# Patient Record
Sex: Female | Born: 1960 | Race: White | Hispanic: No | Marital: Married | Smoking: Never smoker
Health system: Southern US, Community
[De-identification: ages and names within clinical notes are randomized; demographics above are authoritative.]

## PROBLEM LIST (undated history)

## (undated) DIAGNOSIS — T7840XA Allergy, unspecified, initial encounter: Secondary | ICD-10-CM

## (undated) DIAGNOSIS — F32A Depression, unspecified: Secondary | ICD-10-CM

## (undated) DIAGNOSIS — R51 Headache: Secondary | ICD-10-CM

## (undated) DIAGNOSIS — R5382 Chronic fatigue, unspecified: Secondary | ICD-10-CM

## (undated) DIAGNOSIS — F419 Anxiety disorder, unspecified: Secondary | ICD-10-CM

## (undated) DIAGNOSIS — F329 Major depressive disorder, single episode, unspecified: Secondary | ICD-10-CM

## (undated) DIAGNOSIS — I341 Nonrheumatic mitral (valve) prolapse: Secondary | ICD-10-CM

## (undated) HISTORY — DX: Depression, unspecified: F32.A

## (undated) HISTORY — DX: Anxiety disorder, unspecified: F41.9

## (undated) HISTORY — DX: Chronic fatigue, unspecified: R53.82

## (undated) HISTORY — DX: Headache: R51

## (undated) HISTORY — DX: Allergy, unspecified, initial encounter: T78.40XA

## (undated) HISTORY — DX: Nonrheumatic mitral (valve) prolapse: I34.1

## (undated) HISTORY — PX: TUBAL LIGATION: SHX77

## (undated) HISTORY — PX: BREAST SURGERY: SHX581

## (undated) HISTORY — DX: Major depressive disorder, single episode, unspecified: F32.9

## (undated) HISTORY — PX: APPENDECTOMY: SHX54

---

## 1998-02-25 ENCOUNTER — Other Ambulatory Visit: Admission: RE | Admit: 1998-02-25 | Discharge: 1998-02-25 | Payer: Self-pay | Admitting: Dermatology

## 1999-07-09 ENCOUNTER — Other Ambulatory Visit: Admission: RE | Admit: 1999-07-09 | Discharge: 1999-07-09 | Payer: Self-pay | Admitting: Obstetrics and Gynecology

## 1999-07-30 ENCOUNTER — Ambulatory Visit (HOSPITAL_COMMUNITY): Admission: RE | Admit: 1999-07-30 | Discharge: 1999-07-30 | Payer: Self-pay | Admitting: Obstetrics and Gynecology

## 1999-07-30 ENCOUNTER — Encounter: Payer: Self-pay | Admitting: Obstetrics and Gynecology

## 1999-08-01 ENCOUNTER — Encounter: Payer: Self-pay | Admitting: Obstetrics and Gynecology

## 1999-08-01 ENCOUNTER — Ambulatory Visit (HOSPITAL_COMMUNITY): Admission: RE | Admit: 1999-08-01 | Discharge: 1999-08-01 | Payer: Self-pay | Admitting: Obstetrics and Gynecology

## 2001-05-24 ENCOUNTER — Other Ambulatory Visit: Admission: RE | Admit: 2001-05-24 | Discharge: 2001-05-24 | Payer: Self-pay | Admitting: Obstetrics and Gynecology

## 2002-07-17 ENCOUNTER — Other Ambulatory Visit: Admission: RE | Admit: 2002-07-17 | Discharge: 2002-07-17 | Payer: Self-pay | Admitting: Obstetrics and Gynecology

## 2003-08-28 ENCOUNTER — Other Ambulatory Visit: Admission: RE | Admit: 2003-08-28 | Discharge: 2003-08-28 | Payer: Self-pay | Admitting: Obstetrics and Gynecology

## 2004-01-23 ENCOUNTER — Other Ambulatory Visit: Admission: RE | Admit: 2004-01-23 | Discharge: 2004-01-23 | Payer: Self-pay | Admitting: Obstetrics and Gynecology

## 2004-07-23 ENCOUNTER — Other Ambulatory Visit: Admission: RE | Admit: 2004-07-23 | Discharge: 2004-07-23 | Payer: Self-pay | Admitting: Obstetrics and Gynecology

## 2004-08-04 ENCOUNTER — Ambulatory Visit (HOSPITAL_COMMUNITY): Admission: RE | Admit: 2004-08-04 | Discharge: 2004-08-04 | Payer: Self-pay | Admitting: Obstetrics and Gynecology

## 2005-03-19 ENCOUNTER — Ambulatory Visit: Payer: Self-pay | Admitting: Family Medicine

## 2005-04-09 ENCOUNTER — Ambulatory Visit: Payer: Self-pay | Admitting: Family Medicine

## 2005-04-17 ENCOUNTER — Ambulatory Visit: Payer: Self-pay | Admitting: Family Medicine

## 2005-11-12 ENCOUNTER — Other Ambulatory Visit: Admission: RE | Admit: 2005-11-12 | Discharge: 2005-11-12 | Payer: Self-pay | Admitting: Obstetrics and Gynecology

## 2005-11-25 ENCOUNTER — Ambulatory Visit (HOSPITAL_COMMUNITY): Admission: RE | Admit: 2005-11-25 | Discharge: 2005-11-25 | Payer: Self-pay | Admitting: Obstetrics and Gynecology

## 2006-01-29 ENCOUNTER — Ambulatory Visit: Payer: Self-pay | Admitting: Family Medicine

## 2007-02-10 ENCOUNTER — Ambulatory Visit (HOSPITAL_COMMUNITY): Admission: RE | Admit: 2007-02-10 | Discharge: 2007-02-10 | Payer: Self-pay | Admitting: Obstetrics and Gynecology

## 2007-02-20 ENCOUNTER — Emergency Department (HOSPITAL_COMMUNITY): Admission: EM | Admit: 2007-02-20 | Discharge: 2007-02-21 | Payer: Self-pay | Admitting: Emergency Medicine

## 2007-05-16 DIAGNOSIS — R51 Headache: Secondary | ICD-10-CM | POA: Insufficient documentation

## 2007-05-16 DIAGNOSIS — R519 Headache, unspecified: Secondary | ICD-10-CM | POA: Insufficient documentation

## 2008-05-16 ENCOUNTER — Ambulatory Visit: Payer: Self-pay | Admitting: Family Medicine

## 2008-05-16 LAB — CONVERTED CEMR LAB
Bilirubin Urine: NEGATIVE
Blood in Urine, dipstick: NEGATIVE
Glucose, Urine, Semiquant: NEGATIVE
Nitrite: NEGATIVE
Protein, U semiquant: NEGATIVE
Specific Gravity, Urine: 1.025
Urobilinogen, UA: 0.2
pH: 6

## 2008-05-18 ENCOUNTER — Encounter: Payer: Self-pay | Admitting: Family Medicine

## 2008-05-18 LAB — CONVERTED CEMR LAB
AST: 17 units/L (ref 0–37)
Alkaline Phosphatase: 18 units/L — ABNORMAL LOW (ref 39–117)
Anti Nuclear Antibody(ANA): NEGATIVE
Basophils Absolute: 0.1 10*3/uL (ref 0.0–0.1)
Bilirubin, Direct: 0.1 mg/dL (ref 0.0–0.3)
Chloride: 107 meq/L (ref 96–112)
Eosinophils Absolute: 0.1 10*3/uL (ref 0.0–0.7)
GFR calc non Af Amer: 72 mL/min
MCHC: 34.7 g/dL (ref 30.0–36.0)
MCV: 101.1 fL — ABNORMAL HIGH (ref 78.0–100.0)
Neutrophils Relative %: 69.7 % (ref 43.0–77.0)
Platelets: 274 10*3/uL (ref 150–400)
Potassium: 3.5 meq/L (ref 3.5–5.1)
Sodium: 142 meq/L (ref 135–145)
Total Bilirubin: 1 mg/dL (ref 0.3–1.2)
Vitamin B-12: 572 pg/mL (ref 211–911)

## 2008-07-25 ENCOUNTER — Ambulatory Visit: Payer: Self-pay | Admitting: Family Medicine

## 2008-07-25 DIAGNOSIS — J309 Allergic rhinitis, unspecified: Secondary | ICD-10-CM | POA: Insufficient documentation

## 2008-10-02 ENCOUNTER — Encounter: Payer: Self-pay | Admitting: Family Medicine

## 2008-10-17 ENCOUNTER — Telehealth: Payer: Self-pay | Admitting: Family Medicine

## 2008-11-14 ENCOUNTER — Telehealth: Payer: Self-pay | Admitting: Family Medicine

## 2009-03-05 ENCOUNTER — Ambulatory Visit: Payer: Self-pay | Admitting: Family Medicine

## 2009-03-26 ENCOUNTER — Telehealth: Payer: Self-pay | Admitting: Family Medicine

## 2009-03-28 ENCOUNTER — Encounter: Payer: Self-pay | Admitting: Family Medicine

## 2009-04-12 ENCOUNTER — Telehealth: Payer: Self-pay | Admitting: Family Medicine

## 2009-07-16 ENCOUNTER — Telehealth: Payer: Self-pay | Admitting: Family Medicine

## 2009-07-17 ENCOUNTER — Ambulatory Visit: Payer: Self-pay | Admitting: Family Medicine

## 2009-09-02 ENCOUNTER — Telehealth: Payer: Self-pay | Admitting: Family Medicine

## 2009-10-09 ENCOUNTER — Telehealth: Payer: Self-pay | Admitting: Family Medicine

## 2009-11-25 ENCOUNTER — Ambulatory Visit: Payer: Self-pay | Admitting: Family Medicine

## 2009-11-25 LAB — CONVERTED CEMR LAB: Rapid Strep: NEGATIVE

## 2010-09-01 ENCOUNTER — Telehealth: Payer: Self-pay | Admitting: Family Medicine

## 2010-09-30 ENCOUNTER — Telehealth: Payer: Self-pay | Admitting: *Deleted

## 2010-10-07 ENCOUNTER — Telehealth: Payer: Self-pay | Admitting: Family Medicine

## 2010-10-10 ENCOUNTER — Encounter: Payer: Self-pay | Admitting: Family Medicine

## 2010-10-21 NOTE — Progress Notes (Signed)
Summary: Patches for seasickness  Phone Note Call from Patient   Caller: Patient Call For: Nelwyn Salisbury MD Summary of Call: Pt needs seasickness patches sent to [facility] for a 6 day cruise, please.  Initial call taken by: Lynann Beaver CMA,  October 09, 2009 9:02 AM  Follow-up for Phone Call        call in scopolamine patches, apply one behind the ear q 3 days as needed , #5 with 2 rf Follow-up by: Nelwyn Salisbury MD,  October 09, 2009 4:56 PM  Additional Follow-up for Phone Call Additional follow up Details #1::        Phone Call Completed, Rx Called In Additional Follow-up by: Alfred Levins, CMA,  October 09, 2009 5:04 PM    New/Updated Medications: TRANSDERM-SCOP 1.5 MG PT72 (SCOPOLAMINE BASE) apply behind the ear every 3 days as needed Prescriptions: TRANSDERM-SCOP 1.5 MG PT72 (SCOPOLAMINE BASE) apply behind the ear every 3 days as needed  #5 x 2   Entered by:   Alfred Levins, CMA   Authorized by:   Nelwyn Salisbury MD   Signed by:   Alfred Levins, CMA on 10/09/2009   Method used:   Electronically to        [facility]* (retail)       [address]       [place], [place]  [id number]       Ph: [phone]       Fax: [phone]   RxID:   [id number]

## 2010-10-21 NOTE — Assessment & Plan Note (Signed)
Summary: severe sore throat/cjr   Vital Signs:  Patient profile:   50 year old female Weight:      104 pounds Temp:     98.8 degrees F oral BP sitting:   110 / 82  (left arm) Cuff size:   regular  Vitals Entered By: Raechel Ache, RN (November 25, 2009 4:12 PM) CC: C/o bad sore throat since Sat morning.   History of Present Illness: Here for 3 days of a severe ST with few other symptoms. No fever or sinus drainage or cough. Drinking fluids and taking Motrin.   Allergies: 1)  ! Erythromycin  Past History:  Past Medical History: Reviewed history from 07/17/2009 and no changes required. Headache MVP Allergies, sees Dr. Corinda Gubler Depression chronic fatigue sees Dr. Pennie Rushing for GYN exams Anxiety  Review of Systems  The patient denies anorexia, fever, weight loss, weight gain, vision loss, decreased hearing, hoarseness, chest pain, syncope, dyspnea on exertion, peripheral edema, prolonged cough, headaches, hemoptysis, abdominal pain, melena, hematochezia, severe indigestion/heartburn, hematuria, incontinence, genital sores, muscle weakness, suspicious skin lesions, transient blindness, difficulty walking, depression, unusual weight change, abnormal bleeding, enlarged lymph nodes, angioedema, breast masses, and testicular masses.    Physical Exam  General:  Well-developed,well-nourished,in no acute distress; alert,appropriate and cooperative throughout examination Head:  Normocephalic and atraumatic without obvious abnormalities. No apparent alopecia or balding. Eyes:  No corneal or conjunctival inflammation noted. EOMI. Perrla. Funduscopic exam benign, without hemorrhages, exudates or papilledema. Vision grossly normal. Ears:  External ear exam shows no significant lesions or deformities.  Otoscopic examination reveals clear canals, tympanic membranes are intact bilaterally without bulging, retraction, inflammation or discharge. Hearing is grossly normal bilaterally. Nose:  External  nasal examination shows no deformity or inflammation. Nasal mucosa are pink and moist without lesions or exudates. Mouth:  posterior OP is red with no exudate. Uvula is a bit swollen.  Neck:  No deformities, masses, or tenderness noted. Lungs:  Normal respiratory effort, chest expands symmetrically. Lungs are clear to auscultation, no crackles or wheezes. Cervical Nodes:  No lymphadenopathy noted   Impression & Recommendations:  Problem # 1:  PHARYNGITIS (ICD-462)  Her updated medication list for this problem includes:    Minocin 100 Mg Solr (Minocycline hcl) ..... Once daily  Complete Medication List: 1)  Clarinex-d 24 Hour 5-240 Mg Xr24h-tab (Desloratadine-pseudoephedrine) .... Once daily 2)  Minocin 100 Mg Solr (Minocycline hcl) .... Once daily 3)  Temazepam 30 Mg Caps (Temazepam) .... At bedtime 4)  Lexapro 20 Mg Tabs (Escitalopram oxalate) .... Once daily 5)  Alprazolam 0.5 Mg Tabs (Alprazolam) .... Two times a day 6)  Transderm-scop 1.5 Mg Pt72 (Scopolamine base) .... Apply behind the ear every 3 days as needed 7)  Prednisone (pak) 10 Mg Tabs (Prednisone) .... As directed for  6 days  Other Orders: Rapid Strep ([address])  Patient Instructions: 1)  This is viral and should be self limited. try prednisone for comfort.  2)  Please schedule a follow-up appointment as needed .  Prescriptions: LEXAPRO 20 MG TABS (ESCITALOPRAM OXALATE) once daily  #30 x 11   Entered and Authorized by:   Nelwyn Salisbury MD   Signed by:   Nelwyn Salisbury MD on 11/25/2009   Method used:   Electronically to        [facility]* (retail)       [address]       [place], [place]  [address]       Ph: [phone]  Fax: [phone]   RxID:   [id number] PREDNISONE (PAK) 10 MG TABS (PREDNISONE) as directed for  6 days  #1 x 0   Entered and Authorized by:   Nelwyn Salisbury MD   Signed by:   Nelwyn Salisbury MD on 11/25/2009   Method used:   Electronically to        [facility]*  (retail)       [address]       [place], [place]  [id number]       Ph: [phone]       Fax: [phone]   RxID:   [id number]   Laboratory Results  Date/Time Received: November 25, 2009 4:24 PM  Date/Time Reported: November 25, 2009 4:24 PM   Other Tests  Rapid Strep: negative Comments Wynona Canes, CMA  November 25, 2009 4:24 PM

## 2010-10-23 NOTE — Progress Notes (Signed)
Summary: Sierra Peters called to check on status of Restoril.   Phone Note Call from Patient Call back at [facility] Phone [phone]   Caller: Patient Summary of Call: Sierra Peters called to check on status of sleep med. Sierra Peters req that this be called in today if at all possible, if not tomorrow at the latest. Sierra Peters is completely out of Restoril.    Initial call taken by: Lucy Antigua,  September 01, 2010 4:01 PM  Follow-up for Phone Call        call in #30 with 5 rf Follow-up by: Nelwyn Salisbury MD,  September 02, 2010 8:47 AM    Prescriptions: TEMAZEPAM 30 MG CAPS (TEMAZEPAM) at bedtime  #30 x 5   Entered by:   Lynann Beaver CMA AAMA   Authorized by:   Nelwyn Salisbury MD   Signed by:   Lynann Beaver CMA AAMA on 09/02/2010   Method used:   Telephoned to ...       [facility]* (retail)       [address]       [place], [place]  [id number]       Ph: [phone]       Fax: [phone]   RxID:   [id number]

## 2010-10-23 NOTE — Progress Notes (Signed)
Summary: Pt req refill of Lexapro to [facility]. Pt name changed to Sierra Peters  Phone Note Refill Request Call back at Home Phone [phone] Call back at [phone] cell Message from:  Patient on September 30, 2010 9:46 AM  Refills Requested: Medication #1:  LEXAPRO 20 MG TABS once daily   Dosage confirmed as above?Dosage Confirmed   Supply Requested: 1 month Pt called and needs 1 month supply to [facility] order pharmacy.  Pt has gotten married and her new last name is [name].  When this med is sent to [facility], be sure to but pts new name on script. Mem Id # [id number]   Pt only has 6 days lft of med. Pls call in asap   Method Requested: Telephone to Pharmacy Initial call taken by: Lucy Antigua,  September 30, 2010 9:46 AM  Follow-up for Phone Call        call in #30 with 11 rf  Follow-up by: Nelwyn Salisbury MD,  September 30, 2010 2:36 PM  Additional Follow-up for Phone Call Additional follow up Details #1::        Rx sent electronically. Additional Follow-up by: Romualdo Bolk, CMA (AAMA),  September 30, 2010 4:14 PM    Prescriptions: LEXAPRO 20 MG TABS (ESCITALOPRAM OXALATE) once daily  #90 x 3   Entered by:   Romualdo Bolk, CMA (AAMA)   Authorized by:   Nelwyn Salisbury MD   Signed by:   Romualdo Bolk, CMA (AAMA) on 09/30/2010   Method used:   Electronically to        [facility]* (retail)             ,          Ph: [phone]       Fax: [phone]   RxID:   [id number]

## 2010-10-23 NOTE — Progress Notes (Signed)
Summary: pa lexapro  Phone Note Call from Patient Call back at Home Phone [phone]   Caller: Patient Call For: Nelwyn Salisbury MD Summary of Call: Pt needs PA lexapro 20 mg call [phone]   Initial call taken by: Heron Sabins,  October 07, 2010 10:57 AM  Follow-up for Phone Call        PA # [id number] Follow-up by: Heron Sabins,  October 07, 2010 2:41 PM  Additional Follow-up for Phone Call Additional follow up Details #1::        pa in doc folder Additional Follow-up by: Heron Sabins,  October 07, 2010 5:21 PM    Additional Follow-up for Phone Call Additional follow up Details #2::    done  Follow-up by: Nelwyn Salisbury MD,  October 08, 2010 8:41 AM  Additional Follow-up for Phone Call Additional follow up Details #3:: Details for Additional Follow-up Action Taken: Pt called and checked with [facility] last night, and Lexapro is still not been called in. Pls call. PT IS AWARE PA IS WAITING ON DOC Heron Sabins  October 09, 2010 10:06 AM  recieved PA approved  today 10-13-2010 approved til 09-21-2010 til 09/20/2098   Pura Spice, RN  October 13, 2010 3:58 PM  Additional Follow-up by: Lucy Antigua,  October 09, 2010 9:23 AM  Prescriptions: LEXAPRO 20 MG TABS (ESCITALOPRAM OXALATE) once daily  #30 x 11   Entered by:   Pura Spice, RN   Authorized by:   Nelwyn Salisbury MD   Signed by:   Pura Spice, RN on 10/13/2010   Method used:   Electronically to        [facility]* (retail)       [address]       [place], [place]  [id number]       Ph: [phone]       Fax: [phone]   RxID:   [id number]

## 2010-10-23 NOTE — Progress Notes (Signed)
Summary: SAMPLES PLEASE  Phone Note Call from Patient Call back at [phone]   Caller: Patient Call For: Sierra Salisbury MD Summary of Call: pt has 2 lexapro pills left needs samples waiting on PA Initial call taken by: Heron Sabins,  October 07, 2010 10:59 AM  Follow-up for Phone Call        no samples available. We can send in a 30 day supply locally  Follow-up by: Sierra Salisbury MD,  October 07, 2010 1:08 PM  Additional Follow-up for Phone Call Additional follow up Details #1::        pt called. called [place] [facility]  Additional Follow-up by: Pura Spice, RN,  October 07, 2010 4:53 PM     Prescriptions: LEXAPRO 20 MG TABS (ESCITALOPRAM OXALATE) once daily  #30 x 0   Entered by:   Pura Spice, RN   Authorized by:   Sierra Salisbury MD   Signed by:   Pura Spice, RN on 10/07/2010   Method used:   Electronically to        [facility]* (retail)       [address]       [place], [place]  [address]       Ph: [phone]       Fax: [phone]   RxID:   [id number]

## 2010-10-23 NOTE — Medication Information (Signed)
Summary: Approval for Lexapro  Approval for Lexapro   Imported By: Maryln Gottron 10/17/2010 15:37:07  _____________________________________________________________________  External Attachment:    Type:   Image     Comment:   External Document

## 2010-10-29 NOTE — Medication Information (Signed)
Summary: Prior Authorization Request for Lexapro  Prior Authorization Request for Lexapro   Imported By: Maryln Gottron 10/22/2010 10:32:36  _____________________________________________________________________  External Attachment:    Type:   Image     Comment:   External Document

## 2010-11-03 ENCOUNTER — Telehealth: Payer: Self-pay | Admitting: *Deleted

## 2010-11-03 NOTE — Telephone Encounter (Signed)
agreed

## 2010-11-03 NOTE — Telephone Encounter (Signed)
Per Dr. Clent Ridges, go straight to ER.

## 2010-11-03 NOTE — Telephone Encounter (Signed)
Pt is complaining of pain under rib x 2 days and very SOB today.  Per Dr. Lovell Sheehan , go to ER ASAP

## 2010-11-04 ENCOUNTER — Encounter: Payer: Self-pay | Admitting: Family Medicine

## 2010-11-05 ENCOUNTER — Ambulatory Visit (INDEPENDENT_AMBULATORY_CARE_PROVIDER_SITE_OTHER): Payer: 59 | Admitting: Family Medicine

## 2010-11-05 ENCOUNTER — Encounter: Payer: Self-pay | Admitting: Family Medicine

## 2010-11-05 VITALS — BP 108/64 | HR 77 | Temp 98.1°F | Wt 106.0 lb

## 2010-11-05 DIAGNOSIS — R1011 Right upper quadrant pain: Secondary | ICD-10-CM

## 2010-11-05 LAB — POCT URINALYSIS DIPSTICK
Bilirubin, UA: NEGATIVE
Blood, UA: NEGATIVE
Glucose, UA: NEGATIVE
Ketones, UA: NEGATIVE
Leukocytes, UA: NEGATIVE
Nitrite, UA: NEGATIVE
Protein, UA: NEGATIVE
Spec Grav, UA: 1.02
Urobilinogen, UA: 0.2
pH, UA: 7.5

## 2010-11-05 LAB — HEPATIC FUNCTION PANEL
ALT: 19 U/L (ref 0–35)
AST: 19 U/L (ref 0–37)
Albumin: 3.9 g/dL (ref 3.5–5.2)
Alkaline Phosphatase: 22 U/L — ABNORMAL LOW (ref 39–117)
Bilirubin, Direct: 0.1 mg/dL (ref 0.0–0.3)
Total Bilirubin: 0.4 mg/dL (ref 0.3–1.2)
Total Protein: 6.2 g/dL (ref 6.0–8.3)

## 2010-11-05 LAB — BASIC METABOLIC PANEL
BUN: 23 mg/dL (ref 6–23)
CO2: 31 mEq/L (ref 19–32)
Calcium: 9.3 mg/dL (ref 8.4–10.5)
Chloride: 107 mEq/L (ref 96–112)
Creatinine, Ser: 0.8 mg/dL (ref 0.4–1.2)
GFR: 79.14 mL/min (ref >60.00)
Glucose, Bld: 78 mg/dL (ref 70–99)
Potassium: 4.5 mEq/L (ref 3.5–5.1)
Sodium: 142 mEq/L (ref 135–145)

## 2010-11-05 MED ORDER — OMEPRAZOLE 40 MG PO CPDR: 40.0000 mg | DELAYED_RELEASE_CAPSULE | Freq: Two times a day (BID) | ORAL | Status: DC

## 2010-11-05 NOTE — Progress Notes (Signed)
  Subjective:    Patient ID: Sierra Peters, female    DOB: 1961/09/14, 50 y.o.   MRN: [id number]  HPI Here for 5 days of constant aching pains in the RUQ with nausea but no vomiting. No cough but she feels SOB at times. No fever. No change in urinations or BMs. TUMS does not help. She has been under a lot of stress the past 6 months, after she lost her job and only recently found another job. She also has been drinking a lot more alcohol lately, averaging 5-6 drinks of wine and vodka every day.    Review of Systems  Constitutional: Negative.   Respiratory: Positive for shortness of breath. Negative for apnea, cough, choking, chest tightness, wheezing and stridor.   Cardiovascular: Negative.   Gastrointestinal: Positive for nausea and abdominal pain. Negative for vomiting, diarrhea, constipation, blood in stool, abdominal distention and rectal pain.  Genitourinary: Negative.        Objective:   Physical Exam  Constitutional: She appears well-developed and well-nourished.  Cardiovascular: Normal rate, regular rhythm, normal heart sounds and intact distal pulses.  Exam reveals no gallop and no friction rub.   No murmur heard. Pulmonary/Chest: Effort normal and breath sounds normal. No respiratory distress. She has no wheezes. She has no rales. She exhibits no tenderness.  Abdominal: Soft. Bowel sounds are normal. She exhibits no distension and no mass. There is no hepatosplenomegaly. There is tenderness in the right upper quadrant. There is no rigidity, no rebound, no guarding, no tenderness at McBurney's point and negative Murphy's sign.          Assessment & Plan:  This is suspicious for a duodenal ulcer, especially given her use of alcohol lately. Advied her to stop all alcohol use for the time being. Start on Omeprazole. Get labs and an [place] to rule out gallbladder disease.

## 2010-11-06 LAB — CBC WITH DIFFERENTIAL/PLATELET
Basophils Absolute: 0 10*3/uL (ref 0.0–0.1)
Basophils Relative: 0.2 % (ref 0.0–3.0)
Eosinophils Absolute: 0.1 10*3/uL (ref 0.0–0.7)
Eosinophils Relative: 2.3 % (ref 0.0–5.0)
HCT: 37.1 % (ref 36.0–46.0)
Hemoglobin: 12.6 g/dL (ref 12.0–15.0)
Lymphocytes Relative: 21.9 % (ref 12.0–46.0)
Lymphs Abs: 1.2 10*3/uL (ref 0.7–4.0)
MCHC: 34 g/dL (ref 30.0–36.0)
MCV: 99.9 fl (ref 78.0–100.0)
Monocytes Absolute: 0.4 10*3/uL (ref 0.1–1.0)
Monocytes Relative: 7.6 % (ref 3.0–12.0)
Neutro Abs: 3.7 10*3/uL (ref 1.4–7.7)
Neutrophils Relative %: 68 % (ref 43.0–77.0)
Platelets: 283 10*3/uL (ref 150.0–400.0)
RBC: 3.71 Mil/uL — ABNORMAL LOW (ref 3.87–5.11)
RDW: 14 % (ref 11.5–14.6)
WBC: 5.5 10*3/uL (ref 4.5–10.5)

## 2010-11-07 ENCOUNTER — Telehealth: Payer: Self-pay

## 2010-11-07 ENCOUNTER — Telehealth: Payer: Self-pay | Admitting: Family Medicine

## 2010-11-07 NOTE — Telephone Encounter (Signed)
Message copied by Madison Hickman on Fri Nov 07, 2010 11:01 AM ------      Message from: Dwaine Deter      Created: Fri Nov 07, 2010 10:49 AM       normal

## 2010-11-07 NOTE — Telephone Encounter (Signed)
Message copied by Madison Hickman on Fri Nov 07, 2010 11:09 AM ------      Message from: Dwaine Deter      Created: Wed Nov 05, 2010  5:03 PM       normal

## 2010-11-07 NOTE — Telephone Encounter (Signed)
Pt notified of lab results and urinalysis.

## 2010-11-07 NOTE — Telephone Encounter (Signed)
Pt needs blood work results °

## 2010-11-07 NOTE — Telephone Encounter (Signed)
Pt aware of labs and urinalysis

## 2010-11-12 ENCOUNTER — Ambulatory Visit
Admission: RE | Admit: 2010-11-12 | Discharge: 2010-11-12 | Disposition: A | Discharge status: Home / Self Care | Payer: 59 | Source: Ambulatory Visit | Attending: Family Medicine | Admitting: Family Medicine

## 2010-11-12 ENCOUNTER — Telehealth: Payer: Self-pay | Admitting: *Deleted

## 2010-11-12 DIAGNOSIS — R1011 Right upper quadrant pain: Secondary | ICD-10-CM

## 2010-11-12 NOTE — Telephone Encounter (Signed)
Left mess on her cell phone Korea normal to call for appt or call if questions

## 2010-11-12 NOTE — Telephone Encounter (Signed)
Asking for U/S results. Is still having the same pain as Wednesday.

## 2010-11-12 NOTE — Telephone Encounter (Signed)
The [place] was normal, so it is not her gall bladder. Stay on the Omeprazole I gave her. See me again soon if not better

## 2010-11-13 ENCOUNTER — Telehealth: Payer: Self-pay

## 2010-11-13 NOTE — Telephone Encounter (Signed)
Message copied by Madison Hickman on Thu Nov 13, 2010 10:52 AM ------      Message from: Dwaine Deter      Created: Wed Nov 12, 2010  4:46 PM       normal

## 2010-11-13 NOTE — Telephone Encounter (Signed)
Pt notified ultrasound "normal"

## 2010-11-24 ENCOUNTER — Other Ambulatory Visit: Payer: Self-pay | Admitting: Family Medicine

## 2010-11-24 NOTE — Telephone Encounter (Signed)
Call in Zolpidem 10 mg qhs, #30 with 2 rf 

## 2010-11-24 NOTE — Telephone Encounter (Signed)
Triage vm------on Temazepam. No longer working. Still not able to stay asleep. Would like something stronger called to [facility].

## 2010-11-25 MED ORDER — ZOLPIDEM TARTRATE 10 MG PO TABS: 10.0000 mg | ORAL_TABLET | Freq: Every evening | ORAL | Status: DC | PRN

## 2010-11-25 NOTE — Telephone Encounter (Signed)
rx called in Left message on machine for patient   

## 2010-11-25 NOTE — Telephone Encounter (Signed)
Addended by: Kern Reap on: 11/25/2010 05:41 PM   Modules accepted: Orders

## 2011-03-03 ENCOUNTER — Other Ambulatory Visit: Payer: Self-pay | Admitting: Family Medicine

## 2011-03-03 NOTE — Telephone Encounter (Signed)
I called pt to see which medication she is taking either ambien or restoril. Left message for pt to call back.

## 2011-03-03 NOTE — Telephone Encounter (Signed)
Pt called back saying that she is taking ambien and not restoril. Rx sent to pharmacy.

## 2011-03-03 NOTE — Telephone Encounter (Signed)
Ok #30 rf 1 but chart shows restoril as well (historical provider). Recommend one or the other not both

## 2011-03-05 ENCOUNTER — Telehealth: Payer: Self-pay | Admitting: Family Medicine

## 2011-03-05 NOTE — Telephone Encounter (Signed)
Per last office note drinking several ETOH drinks per day. I do not feel comfortable calling in alprazolam which could have dangerous interaction with ETOH.  She should schedule f/u with Dr Clent Ridges to discuss.

## 2011-03-05 NOTE — Telephone Encounter (Signed)
Pt is req to be put back on Alprazolam 0.5 mg or higher dose due to anxiety issues. Pt req to get 1 weeks worth called in today asap, and then she can sch ov with Dr Clent Ridges if needed for next week. Pls call in to [facility]

## 2011-03-06 NOTE — Telephone Encounter (Signed)
Pt is req a work in ov to see Dr Clent Ridges on Monday 03/09/11, in order to get refill of xanax. Pls advise if wok in ok?

## 2011-03-06 NOTE — Telephone Encounter (Signed)
Left message on machine about not being able to refill rx and to call office to schedule an appt with Dr. Clent Ridges.

## 2011-03-09 NOTE — Telephone Encounter (Signed)
Please work her in for ASAP

## 2011-03-10 NOTE — Telephone Encounter (Signed)
Lft vm for pt that Dr Clent Ridges approved work in appt. Waiting on call back.

## 2011-03-10 NOTE — Telephone Encounter (Signed)
Pt has been sch for work in ov on Thurs 03/12/11 as noted.

## 2011-03-12 ENCOUNTER — Encounter: Payer: Self-pay | Admitting: Family Medicine

## 2011-03-12 ENCOUNTER — Ambulatory Visit (INDEPENDENT_AMBULATORY_CARE_PROVIDER_SITE_OTHER): Payer: 59 | Admitting: Family Medicine

## 2011-03-12 VITALS — BP 110/70 | HR 60 | Wt 99.0 lb

## 2011-03-12 DIAGNOSIS — F32A Depression, unspecified: Secondary | ICD-10-CM

## 2011-03-12 DIAGNOSIS — F329 Major depressive disorder, single episode, unspecified: Secondary | ICD-10-CM

## 2011-03-12 DIAGNOSIS — F411 Generalized anxiety disorder: Secondary | ICD-10-CM

## 2011-03-12 DIAGNOSIS — F419 Anxiety disorder, unspecified: Secondary | ICD-10-CM

## 2011-03-12 DIAGNOSIS — G4700 Insomnia, unspecified: Secondary | ICD-10-CM

## 2011-03-12 MED ORDER — ALPRAZOLAM 0.5 MG PO TABS: 0.5000 mg | ORAL_TABLET | Freq: Three times a day (TID) | ORAL | Status: DC | PRN

## 2011-03-12 MED ORDER — ZOLPIDEM TARTRATE 10 MG PO TABS: 10.0000 mg | ORAL_TABLET | Freq: Every evening | ORAL | Status: DC | PRN

## 2011-03-12 MED ORDER — ESTRADIOL 0.1 MG/24HR TD PTTW: 1.0000 | MEDICATED_PATCH | TRANSDERMAL | Status: DC

## 2011-03-12 MED ORDER — ESCITALOPRAM OXALATE 20 MG PO TABS: 20.0000 mg | ORAL_TABLET | Freq: Every day | ORAL | Status: DC

## 2011-03-12 MED ORDER — LEVONORGESTREL 20 MCG/24HR IU IUD: 1.0000 | INTRAUTERINE_SYSTEM | Freq: Once | INTRAUTERINE | Status: DC

## 2011-03-12 NOTE — Progress Notes (Signed)
  Subjective:    Patient ID: Sierra Peters, female    DOB: Jan 30, 1961, 50 y.o.   MRN: [id number]  HPI Here to discuss anxiety and depression. She has been on Lexapro for several years, and she is pleased with how this has helped her depression symptoms. However her anxiety has not been well controlled, and this has been a big problem for the past 4 months. 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 This pays a much lower salary, and this has been difficult for her. Her self esteem has plummeted, and she has become very anxious, quick tempered, and has had some marital problems. She is seeing a Veterinary surgeon, and this is helpful. She asks if something can be added to the Lexapro.    Review of Systems  Constitutional: Negative.   Psychiatric/Behavioral: Positive for dysphoric mood, decreased concentration and agitation. Negative for confusion. The patient is nervous/anxious.        Objective:   Physical Exam  Constitutional: She appears well-developed and well-nourished.  Psychiatric: She has a normal mood and affect. Her behavior is normal. Thought content normal.          Assessment & Plan:  Add Xanax as needed to the Lexapro. Recheck in 2 weeks.

## 2011-04-03 ENCOUNTER — Other Ambulatory Visit: Payer: Self-pay | Admitting: *Deleted

## 2011-04-03 NOTE — Telephone Encounter (Signed)
Pt last here on 03/12/11 and last filled on 03/12/11.

## 2011-04-03 NOTE — Telephone Encounter (Signed)
I called pharmacy, pt did have 4 more refills.

## 2011-04-03 NOTE — Telephone Encounter (Signed)
Refill Ambien to [facility]

## 2011-04-03 NOTE — Telephone Encounter (Signed)
Pt has refills on this.

## 2011-06-08 ENCOUNTER — Telehealth: Payer: Self-pay | Admitting: Family Medicine

## 2011-06-08 NOTE — Telephone Encounter (Signed)
Pt is still having issuses sleeping even with taking zolpidem (AMBIEN) 10 MG tablet. Pt would like to know if she could either get a different medication or take 2 zolpidem. Please contact pt.

## 2011-06-09 NOTE — Telephone Encounter (Signed)
Pt callback checking on status of previous message. Pt is aware waiting on MD to reply.

## 2011-06-10 NOTE — Telephone Encounter (Signed)
Pls advise.  

## 2011-06-11 NOTE — Telephone Encounter (Signed)
Try taking 2 Zolpidems at bedtime, then call back to let us know how this works. We can change her rx if needed

## 2011-06-11 NOTE — Telephone Encounter (Signed)
Left voice message.

## 2011-07-01 ENCOUNTER — Telehealth: Payer: Self-pay | Admitting: Family Medicine

## 2011-07-01 NOTE — Telephone Encounter (Signed)
Pt called and is having stomach pain and lower back pain. Pt has taken otc zantac and taking tums. Pt req work in [employer] or med called in to [facility]. Pt would like a call back from nurse asap.

## 2011-07-01 NOTE — Telephone Encounter (Signed)
Last 36 hours pt is having severe epigastric pain and low back pain. No fever, chills or vomiting or diarrhea. No urinary complaints.  She is taking Zantac since yesterday.  Scheduled tomorrow with Dr. Clent Ridges.

## 2011-07-01 NOTE — Telephone Encounter (Signed)
Pt. Cancelled appt per Nelva Bush.

## 2011-07-01 NOTE — Telephone Encounter (Signed)
Line busy x 2

## 2011-07-02 ENCOUNTER — Ambulatory Visit: Payer: 59 | Admitting: Family Medicine

## 2011-07-07 ENCOUNTER — Telehealth: Payer: Self-pay | Admitting: Family Medicine

## 2011-07-07 MED ORDER — ZOLPIDEM TARTRATE 10 MG PO TABS: 10.0000 mg | ORAL_TABLET | Freq: Every evening | ORAL | Status: DC | PRN

## 2011-07-07 NOTE — Telephone Encounter (Signed)
Script called in and pt aware. 

## 2011-09-28 ENCOUNTER — Other Ambulatory Visit: Payer: Self-pay | Admitting: Family Medicine

## 2011-09-28 NOTE — Telephone Encounter (Signed)
Script called in and left voice message for pt. 

## 2011-09-28 NOTE — Telephone Encounter (Signed)
Pt called to check on status of getting refill for Alprazolam to [facility]. Pt wants to be sure that this med is called in today, before Dr Clent Ridges leaves for the day.

## 2011-09-28 NOTE — Telephone Encounter (Signed)
Asking for refills for Xanax. Call in #90 with 5 rf

## 2011-10-21 ENCOUNTER — Ambulatory Visit: Payer: 59 | Admitting: Family Medicine

## 2011-12-23 ENCOUNTER — Telehealth: Payer: Self-pay | Admitting: Family Medicine

## 2011-12-23 NOTE — Telephone Encounter (Signed)
Pt is having a lot of head congestion, sinus pressure and headaches and would like an rx called in  [place] [facility]

## 2011-12-23 NOTE — Telephone Encounter (Signed)
Called pt to make aware that Dr. Clent Ridges is out of the office.  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  Pt will see how she is feeling and possibly call back to make an appt.

## 2011-12-30 ENCOUNTER — Ambulatory Visit: Payer: 59 | Admitting: Family Medicine

## 2012-01-27 ENCOUNTER — Ambulatory Visit (INDEPENDENT_AMBULATORY_CARE_PROVIDER_SITE_OTHER): Payer: 59 | Admitting: Family Medicine

## 2012-01-27 ENCOUNTER — Encounter: Payer: Self-pay | Admitting: Family Medicine

## 2012-01-27 VITALS — BP 106/62 | HR 63 | Temp 99.4°F | Wt 104.0 lb

## 2012-01-27 DIAGNOSIS — R5383 Other fatigue: Secondary | ICD-10-CM

## 2012-01-27 DIAGNOSIS — M1990 Unspecified osteoarthritis, unspecified site: Secondary | ICD-10-CM

## 2012-01-27 DIAGNOSIS — R5381 Other malaise: Secondary | ICD-10-CM

## 2012-01-27 DIAGNOSIS — F329 Major depressive disorder, single episode, unspecified: Secondary | ICD-10-CM

## 2012-01-27 DIAGNOSIS — F32A Depression, unspecified: Secondary | ICD-10-CM

## 2012-01-27 MED ORDER — MELOXICAM 15 MG PO TABS: 15.0000 mg | ORAL_TABLET | Freq: Every day | ORAL | Status: AC

## 2012-01-27 NOTE — Progress Notes (Signed)
  Subjective:    Patient ID: Sierra Peters, female    DOB: May 19, 1961, 51 y.o.   MRN: [id number]  HPI Here to discuss a number of issues. First she continues to feel tired all the time, and this has actually gotten worse. She recently had labs done with her GYN, and these were all normal. She wonders if her meds could be causing fatigue. She has a hx of anxiety and depression, and she has ben on a combination of Lexapro and Xanax for several years. She thinks she has had more depression lately and less anxiety, and wonders if this is the source of her fatigue. She is still very active and exercises regularly. Also she has developed stiffness and pain in both of her hands, especially the right thumb and her right hand fingers. No other joint problems. Motrin does not help much.    Review of Systems  Constitutional: Negative for activity change, appetite change and unexpected weight change.  Respiratory: Negative.   Cardiovascular: Negative.   Gastrointestinal: Negative.   Musculoskeletal: Positive for arthralgias. Negative for back pain and joint swelling.  Psychiatric/Behavioral: Positive for sleep disturbance and dysphoric mood. Negative for behavioral problems, confusion, decreased concentration and agitation. The patient is nervous/anxious.        Objective:   Physical Exam  Constitutional: She appears well-developed and well-nourished.  Cardiovascular: Normal rate, regular rhythm, normal heart sounds and intact distal pulses.   Pulmonary/Chest: Effort normal and breath sounds normal.  Musculoskeletal:       Tender over the right first MCP joint. She has Heberdens nodes over the DIPs and PIPs of both hands, no edema  Psychiatric: She has a normal mood and affect. Her behavior is normal. Judgment and thought content normal.          Assessment & Plan:  Her fatigue is probably due to a number of things, but certainly her depression is a big factor. We will taper off Lexapro (10 mg a  day for 2 weeks, then stop) to see if her energy level improves. At that point we will probably start her on Wellbutrin. Try Meloxicam for the arthritis pain. She will follow up in 4 weeks

## 2012-01-28 ENCOUNTER — Other Ambulatory Visit: Payer: Self-pay | Admitting: Family Medicine

## 2012-01-29 NOTE — Telephone Encounter (Signed)
Call in #30 with 5 rf 

## 2012-02-02 ENCOUNTER — Other Ambulatory Visit (HOSPITAL_COMMUNITY): Payer: Self-pay | Admitting: Obstetrics

## 2012-02-02 DIAGNOSIS — Z1231 Encounter for screening mammogram for malignant neoplasm of breast: Secondary | ICD-10-CM

## 2012-02-16 ENCOUNTER — Telehealth: Payer: Self-pay | Admitting: *Deleted

## 2012-02-16 NOTE — Telephone Encounter (Signed)
Pt stated she has discussed this with dr fry at last ov-- pt titrated off lexapro and has been off now for about 2 weeks and feels depressed and e motional-as you had discussed she wants to start on wellbutrin--[facility]

## 2012-02-17 MED ORDER — BUPROPION HCL ER (XL) 150 MG PO TB24: 150.0000 mg | ORAL_TABLET | Freq: Every day | ORAL | Status: DC

## 2012-02-17 NOTE — Telephone Encounter (Signed)
Call in Wellbutrin XL 150 mg a day, #30 with 2 rf

## 2012-02-17 NOTE — Telephone Encounter (Signed)
I called in script 

## 2012-03-09 ENCOUNTER — Other Ambulatory Visit: Payer: Self-pay | Admitting: Family Medicine

## 2012-03-09 ENCOUNTER — Ambulatory Visit (HOSPITAL_COMMUNITY)
Admission: RE | Admit: 2012-03-09 | Discharge: 2012-03-09 | Disposition: A | Discharge status: Home / Self Care | Payer: 59 | Source: Ambulatory Visit | Attending: Obstetrics | Admitting: Obstetrics

## 2012-03-09 DIAGNOSIS — Z1231 Encounter for screening mammogram for malignant neoplasm of breast: Secondary | ICD-10-CM

## 2012-04-30 ENCOUNTER — Other Ambulatory Visit: Payer: Self-pay | Admitting: Family Medicine

## 2012-05-02 ENCOUNTER — Other Ambulatory Visit: Payer: Self-pay

## 2012-05-02 NOTE — Telephone Encounter (Signed)
Fax refill request from [facility] for alprazolam  Last seen 01/27/12 multiple issues Last written 03/12/2011 # 90 5RF Please advise

## 2012-05-02 NOTE — Telephone Encounter (Signed)
Last seen 01/27/12 rov  Last written 09/28/11 # [phone] 5Rf Please advise

## 2012-05-03 NOTE — Telephone Encounter (Signed)
Call in #90 with 5 rf 

## 2012-05-03 NOTE — Telephone Encounter (Signed)
Pt is out of alprazolam °

## 2012-05-04 MED ORDER — ALPRAZOLAM 0.5 MG PO TABS: 0.5000 mg | ORAL_TABLET | Freq: Three times a day (TID) | ORAL | Status: DC | PRN

## 2012-05-04 NOTE — Telephone Encounter (Signed)
done

## 2012-07-13 ENCOUNTER — Telehealth: Payer: Self-pay | Admitting: Family Medicine

## 2012-07-13 MED ORDER — OMEPRAZOLE 40 MG PO CPDR: 40.0000 mg | DELAYED_RELEASE_CAPSULE | Freq: Every day | ORAL | Status: DC

## 2012-07-13 NOTE — Telephone Encounter (Signed)
Pt called and said that she has another ulcer in her stomach. Pt said that Dr Clent Ridges has prescribed a med in the past for ulcers and pt is req refill to be called in to [facility].

## 2012-07-13 NOTE — Telephone Encounter (Signed)
I sent script e-scribe and left voice message for pt 

## 2012-07-13 NOTE — Telephone Encounter (Signed)
Call in Omeprazole 40 mg a day, #30 with 11 rf 

## 2013-01-09 ENCOUNTER — Ambulatory Visit (INDEPENDENT_AMBULATORY_CARE_PROVIDER_SITE_OTHER): Payer: 59 | Admitting: Family Medicine

## 2013-01-09 ENCOUNTER — Encounter: Payer: Self-pay | Admitting: Family Medicine

## 2013-01-09 ENCOUNTER — Encounter: Payer: Self-pay | Admitting: *Deleted

## 2013-01-09 VITALS — BP 108/64 | Temp 97.9°F

## 2013-01-09 DIAGNOSIS — R0602 Shortness of breath: Secondary | ICD-10-CM

## 2013-01-09 DIAGNOSIS — R5383 Other fatigue: Secondary | ICD-10-CM

## 2013-01-09 DIAGNOSIS — R5381 Other malaise: Secondary | ICD-10-CM

## 2013-01-09 DIAGNOSIS — R000 Tachycardia, unspecified: Secondary | ICD-10-CM

## 2013-01-09 LAB — POCT URINALYSIS DIPSTICK
Bilirubin, UA: NEGATIVE
Ketones, UA: NEGATIVE
pH, UA: 8.5

## 2013-01-09 MED ORDER — DEXLANSOPRAZOLE 60 MG PO CPDR: 60.0000 mg | DELAYED_RELEASE_CAPSULE | Freq: Every day | ORAL | Status: DC

## 2013-01-09 MED ORDER — ALPRAZOLAM 0.5 MG PO TABS: 0.5000 mg | ORAL_TABLET | Freq: Three times a day (TID) | ORAL | Status: DC | PRN

## 2013-01-09 NOTE — Progress Notes (Signed)
  Subjective:    Patient ID: Sierra Peters, female    DOB: 1961-05-31, 52 y.o.   MRN: [id number]  HPI Here for one week of chronic fatigue, so much that she has to lie down to rest after working in her yard for a brief period. She also has intermittent nausea without vomiting, SOB, and she feels her heart racing. No chest pain or pressure. She denies any stressors in her life. She recently changed jobs and now she is the happiest she has been in years. She sleeps well. Her GERD is controlled on Dexilant, but she is set to have upper and lower endoscopy in a few weeks per Dr. Kinnie Scales. She is very worried about a strong family hx of heart disease.  She relates that she had Botox for the first time about 2 weeks ago, where she got a total of 40 injections around the face.   Review of Systems  Constitutional: Positive for fatigue. Negative for fever and diaphoresis.  Respiratory: Positive for shortness of breath. Negative for cough, chest tightness and wheezing.   Cardiovascular: Negative for chest pain and leg swelling.  Gastrointestinal: Positive for nausea. Negative for vomiting, abdominal pain, diarrhea, constipation, blood in stool and abdominal distention.       Objective:   Physical Exam  Constitutional: She appears well-developed and well-nourished. No distress.  Neck: No thyromegaly present.  Cardiovascular: Normal rate, regular rhythm, normal heart sounds and intact distal pulses.   EKG normal   Pulmonary/Chest: Effort normal and breath sounds normal. No respiratory distress. She has no wheezes. She has no rales.  Abdominal: Soft. Bowel sounds are normal. She exhibits no distension and no mass. There is no tenderness. There is no rebound and no guarding.  Lymphadenopathy:    She has no cervical adenopathy.          Assessment & Plan:  Various symptoms which could represent cardiac problems, but this seems less likely. We will set up a stress test soon to evaluate. Get labs today,  since metabolic issues like anemia or thyroid disorders can also present this way.

## 2013-01-10 ENCOUNTER — Telehealth: Payer: Self-pay | Admitting: Family Medicine

## 2013-01-10 LAB — CBC WITH DIFFERENTIAL/PLATELET
Basophils Relative: 0.8 % (ref 0.0–3.0)
Eosinophils Relative: 1.3 % (ref 0.0–5.0)
Lymphocytes Relative: 19.1 % (ref 12.0–46.0)
MCV: 99.5 fl (ref 78.0–100.0)
Neutrophils Relative %: 72.4 % (ref 43.0–77.0)
RBC: 3.89 Mil/uL (ref 3.87–5.11)
WBC: 7.2 10*3/uL (ref 4.5–10.5)

## 2013-01-10 LAB — BASIC METABOLIC PANEL
BUN: 17 mg/dL (ref 6–23)
GFR: 66.18 mL/min (ref >60.00)
Potassium: 3.8 mEq/L (ref 3.5–5.1)
Sodium: 138 mEq/L (ref 135–145)

## 2013-01-10 LAB — HEPATIC FUNCTION PANEL
AST: 21 U/L (ref 0–37)
Bilirubin, Direct: 0 mg/dL (ref 0.0–0.3)
Total Bilirubin: 0.6 mg/dL (ref 0.3–1.2)

## 2013-01-10 LAB — TSH: TSH: 0.32 u[IU]/mL — ABNORMAL LOW (ref 0.35–5.50)

## 2013-01-10 MED ORDER — NITROFURANTOIN MONOHYD MACRO 100 MG PO CAPS: 100.0000 mg | ORAL_CAPSULE | Freq: Two times a day (BID) | ORAL | Status: DC

## 2013-01-10 NOTE — Telephone Encounter (Signed)
Called pt about referral for stress test pt requested to have a note sent to nurse requesting to be contacted about labs. Please call pt at [phone]

## 2013-01-10 NOTE — Progress Notes (Signed)
Quick Note:  I sent script e-scribe, left voice message with below information and released results in my chart, also put a copy of results in mail. ______

## 2013-01-10 NOTE — Addendum Note (Signed)
Addended by: Gershon Crane A on: 01/10/2013 01:34 PM   Modules accepted: Orders

## 2013-01-10 NOTE — Addendum Note (Signed)
Addended by: Aniceto Boss A on: 01/10/2013 05:07 PM   Modules accepted: Orders

## 2013-01-11 ENCOUNTER — Other Ambulatory Visit (INDEPENDENT_AMBULATORY_CARE_PROVIDER_SITE_OTHER): Payer: 59

## 2013-01-11 DIAGNOSIS — R5383 Other fatigue: Secondary | ICD-10-CM

## 2013-01-11 DIAGNOSIS — R5381 Other malaise: Secondary | ICD-10-CM

## 2013-01-11 NOTE — Telephone Encounter (Signed)
We spoke to her this am

## 2013-01-12 ENCOUNTER — Telehealth: Payer: Self-pay | Admitting: Family Medicine

## 2013-01-12 NOTE — Telephone Encounter (Signed)
Pt would like blood work resullts

## 2013-01-13 NOTE — Progress Notes (Signed)
Quick Note:  I spoke with pt and released results in my chart ______ 

## 2013-01-13 NOTE — Telephone Encounter (Signed)
I spoke with pt  

## 2013-01-17 ENCOUNTER — Ambulatory Visit (HOSPITAL_COMMUNITY): Payer: 59 | Attending: Cardiology | Admitting: Radiology

## 2013-01-17 VITALS — BP 118/91 | Ht 63.0 in | Wt 103.0 lb

## 2013-01-17 DIAGNOSIS — R5381 Other malaise: Secondary | ICD-10-CM | POA: Insufficient documentation

## 2013-01-17 DIAGNOSIS — R000 Tachycardia, unspecified: Secondary | ICD-10-CM | POA: Insufficient documentation

## 2013-01-17 DIAGNOSIS — R110 Nausea: Secondary | ICD-10-CM | POA: Insufficient documentation

## 2013-01-17 DIAGNOSIS — R0602 Shortness of breath: Secondary | ICD-10-CM

## 2013-01-17 DIAGNOSIS — R0989 Other specified symptoms and signs involving the circulatory and respiratory systems: Secondary | ICD-10-CM | POA: Insufficient documentation

## 2013-01-17 DIAGNOSIS — R0609 Other forms of dyspnea: Secondary | ICD-10-CM | POA: Insufficient documentation

## 2013-01-17 DIAGNOSIS — I491 Atrial premature depolarization: Secondary | ICD-10-CM

## 2013-01-17 DIAGNOSIS — Z8249 Family history of ischemic heart disease and other diseases of the circulatory system: Secondary | ICD-10-CM | POA: Insufficient documentation

## 2013-01-17 DIAGNOSIS — R079 Chest pain, unspecified: Secondary | ICD-10-CM | POA: Insufficient documentation

## 2013-01-17 DIAGNOSIS — R002 Palpitations: Secondary | ICD-10-CM

## 2013-01-17 DIAGNOSIS — R5383 Other fatigue: Secondary | ICD-10-CM | POA: Insufficient documentation

## 2013-01-17 MED ORDER — TECHNETIUM TC 99M SESTAMIBI GENERIC - CARDIOLITE
30.0000 | Freq: Once | INTRAVENOUS | Status: AC | PRN
  Administered 2013-01-17: 30 via INTRAVENOUS

## 2013-01-17 MED ORDER — TECHNETIUM TC 99M SESTAMIBI GENERIC - CARDIOLITE
10.0000 | Freq: Once | INTRAVENOUS | Status: AC | PRN
  Administered 2013-01-17: 10 via INTRAVENOUS

## 2013-01-17 NOTE — Progress Notes (Signed)
[facility] SITE 3 NUCLEAR MED [address] [place], [place] [address] [phone]    Cardiology Nuclear Med Study  Sierra Peters is a 52 y.o. female     MRN : [id number]     DOB: 08/09/1961  Procedure Date: 01/17/2013  Nuclear Med Background Indication for Stress Test:  Evaluation for Ischemia History:  No prior cardiac Hx Cardiac Risk Factors: Family History - CAD  Symptoms:  Chest Pain, DOE, Fatigue, Fatigue with Exertion, Nausea and Rapid HR   Nuclear Pre-Procedure Caffeine/Decaff Intake:  None> 12 hrs NPO After: 6:30am   Lungs:  clear O2 Sat: 100% on room air. IV 0.9% NS with Angio Cath:  20g  IV Site: R Antecubital x 1, tolerated well IV Started by:  Irean Hong, RN  Chest Size (in):  34 Cup Size: D  Height: 5\' 3"  (1.6 m)  Weight:  103 lb (46.72 kg)  BMI:  Body mass index is 18.25 kg/(m^2). Tech Comments:  No medications today    Nuclear Med Study 1 or 2 day study: 1 day  Stress Test Type:  Stress  Reading MD: Willa Rough, MD  Order Authorizing Provider:  Gershon Crane, MD  Resting Radionuclide: Technetium 71m Sestamibi  Resting Radionuclide Dose: 11.0 mCi   Stress Radionuclide:  Technetium 57m Sestamibi  Stress Radionuclide Dose: 33.0 mCi           Stress Protocol Rest HR: 77 Stress HR: 155  Rest BP: 118/91 Stress BP: 156/80  Exercise Time (min): 9:00 METS: 10.10   Predicted Max HR: 170 bpm % Max HR: 91.18 bpm Rate Pressure Product: 46962   Dose of Adenosine (mg):  n/a Dose of Lexiscan: n/a mg  Dose of Atropine (mg): n/a Dose of Dobutamine: n/a mcg/kg/min (at max HR)  Stress Test Technologist: Milana Na, EMT-P  Nuclear Technologist:  Domenic Polite, CNMT     Rest Procedure:  Myocardial perfusion imaging was performed at rest 45 minutes following the intravenous administration of Technetium 32m Sestamibi. Rest ECG: NSR - Normal EKG  Stress Procedure:  The patient exercised on the treadmill utilizing the Bruce Protocol for 9:00  minutes. The patient stopped due to fatigue, and chest heaviness. This patient had a run of PAT and rare pacs during pretest and the test. Technetium 4m Sestamibi was injected at peak exercise and myocardial perfusion imaging was performed after a brief delay. Stress ECG: No significant change from baseline ECG  QPS Raw Data Images:  Normal; no motion artifact; normal heart/lung ratio. Stress Images:  Normal homogeneous uptake in all areas of the myocardium. Rest Images:  Normal homogeneous uptake in all areas of the myocardium. Subtraction (SDS):  No evidence of ischemia. Transient Ischemic Dilatation (Normal <1.22):  1.05 Lung/Heart Ratio (Normal <0.45):  0.37  Quantitative Gated Spect Images QGS EDV:  63 ml QGS ESV:  21 ml  Impression Exercise Capacity:  Fair exercise capacity. BP Response:  Normal blood pressure response. Clinical Symptoms:  The patient had chest heaviness 7/10 early in stress that persisted throughout distress. This continued after stress also. ECG Impression:  No significant ST segment change suggestive of ischemia. Comparison with Prior Nuclear Study: No images to compare  Overall Impression:  Normal stress nuclear study. Despite having chest heaviness when the study started in through the study and after the study, EKG are normal. The nuclear images are completely normal. There is no significant abnormality. This is a low risk scan.  LV Ejection Fraction: 66%.  LV Wall Motion:  Normal Wall Motion.  Willa Rough, MD

## 2013-01-18 ENCOUNTER — Telehealth: Payer: Self-pay | Admitting: Family Medicine

## 2013-01-18 DIAGNOSIS — R5381 Other malaise: Secondary | ICD-10-CM

## 2013-01-18 DIAGNOSIS — R5383 Other fatigue: Secondary | ICD-10-CM

## 2013-01-18 NOTE — Telephone Encounter (Signed)
Patient called stating that she would like a call back with stress test results. Please assist.

## 2013-01-18 NOTE — Telephone Encounter (Signed)
See the result note. It was normal

## 2013-01-18 NOTE — Telephone Encounter (Signed)
Tell her I put in orders for a vitamin D level and for Epstein-Barr virus (which is associated with mononucleosis and so called chronic fatigue syndrome). She can schedule a lab appt for these

## 2013-01-18 NOTE — Telephone Encounter (Signed)
Called and spoke with pt and pt is aware.  Pt states she would like to know what the next step is.  Pt states she has read up on this and chronic fatigue is one of the common things she finds.  Pt states she wants to know what the cause of this is.

## 2013-01-18 NOTE — Progress Notes (Signed)
Called and spoke with pt and pt is aware.  

## 2013-01-18 NOTE — Telephone Encounter (Signed)
Spoke to pt told her Dr. Clent Ridges put in orders for a vitamin D level and for Epstein-Barr virus (which is associated with mononucleosis and so called chronic fatigue syndrome). She can call and schedule a lab appt in the morning. Pt verbalized understanding.

## 2013-01-20 ENCOUNTER — Other Ambulatory Visit (INDEPENDENT_AMBULATORY_CARE_PROVIDER_SITE_OTHER): Payer: 59

## 2013-01-20 DIAGNOSIS — R5383 Other fatigue: Secondary | ICD-10-CM

## 2013-01-20 DIAGNOSIS — R5381 Other malaise: Secondary | ICD-10-CM

## 2013-01-21 LAB — VITAMIN D 25 HYDROXY (VIT D DEFICIENCY, FRACTURES): Vit D, 25-Hydroxy: 53 ng/mL (ref 30–89)

## 2013-01-25 LAB — EPSTEIN-BARR VIRUS VCA, IGG: EBV VCA IgG: 56.2 U/mL — ABNORMAL HIGH (ref <18.0)

## 2013-01-25 LAB — EPSTEIN-BARR VIRUS VCA, IGM: EBV VCA IgM: 13.6 U/mL (ref <36.0)

## 2013-01-25 NOTE — Progress Notes (Signed)
Quick Note:  I spoke with pt and released results in my chart ______

## 2013-02-02 ENCOUNTER — Telehealth: Payer: Self-pay | Admitting: Family Medicine

## 2013-02-02 NOTE — Telephone Encounter (Addendum)
Pt would like you to call her concerning her labs results. Pt has been on vacation and could not get back to you sooner. pls call pt on that work number.

## 2013-02-03 NOTE — Telephone Encounter (Signed)
I spoke with pt  

## 2013-02-07 HISTORY — PX: ESOPHAGOGASTRODUODENOSCOPY: SHX1529

## 2013-02-07 HISTORY — PX: COLONOSCOPY: SHX174

## 2013-02-14 ENCOUNTER — Telehealth: Payer: Self-pay | Admitting: Family Medicine

## 2013-02-14 NOTE — Telephone Encounter (Signed)
I left voice message with below information. 

## 2013-02-14 NOTE — Telephone Encounter (Signed)
Pt would like to know if she could start taking the b12 shot for her fatigue. Advised pt of b12 shortage. In the meantime is there a supplement pt could take?

## 2013-02-14 NOTE — Telephone Encounter (Signed)
Try OTC vitamin B12, use the kind that dissolves under the tongue

## 2013-02-20 ENCOUNTER — Telehealth: Payer: Self-pay | Admitting: Family Medicine

## 2013-02-20 NOTE — Telephone Encounter (Signed)
Refill request for Zolpidem Tartrate 10 mg take 2 po qhs prn and send to [facility].

## 2013-02-21 NOTE — Telephone Encounter (Signed)
Call in #60 with 5 rf 

## 2013-02-22 MED ORDER — ZOLPIDEM TARTRATE 10 MG PO TABS: 20.0000 mg | ORAL_TABLET | Freq: Every evening | ORAL | Status: DC | PRN

## 2013-02-22 NOTE — Telephone Encounter (Signed)
I called in script 

## 2013-03-06 ENCOUNTER — Other Ambulatory Visit: Payer: Self-pay | Admitting: Family Medicine

## 2013-05-23 ENCOUNTER — Encounter: Payer: Self-pay | Admitting: Family Medicine

## 2013-05-23 ENCOUNTER — Ambulatory Visit (INDEPENDENT_AMBULATORY_CARE_PROVIDER_SITE_OTHER): Payer: 59 | Admitting: Family Medicine

## 2013-05-23 VITALS — BP 120/84 | Temp 98.7°F | Wt 107.0 lb

## 2013-05-23 DIAGNOSIS — F329 Major depressive disorder, single episode, unspecified: Secondary | ICD-10-CM

## 2013-05-23 DIAGNOSIS — F411 Generalized anxiety disorder: Secondary | ICD-10-CM

## 2013-05-24 ENCOUNTER — Encounter: Payer: Self-pay | Admitting: Family Medicine

## 2013-05-24 NOTE — Progress Notes (Signed)
  Subjective:    Patient ID: Sierra Peters, female    DOB: 23-Jul-1961, 52 y.o.   MRN: [id number]  HPI Here to discuss anxiety and depression. She has struggled with these off and on for years, and she had been on Lexapro 20 mg daily for several years. This had helped her a lot but it caused a lack of libido which interfered with her marriage. She decided to stop it but unfortunately did so cold [place] about 3 weeks ago. She has felt terrible since then, with sweats, shakes, hot and cold feelings, and difficulty sleeping. Her moods have been labile and she has been quite irritable. She loses her temper quickly, and this has affected her job and her marriage. Of note she had tried some other SSRI's in the past including Paxil and had a lot of side effects. She had tried Wellbutrin with side effects. She has done well with Xanax, but she only takes this at night to help with sleep. She continues to exercise. She drinks 2 drinks every night, either glasses of wine or cocktails. She says there are a lot of things that have caused stress in her life, including during her childhood but that she has never talked about these things with anyone. She has never been in therapy.    Review of Systems  Constitutional: Negative.   Neurological: Negative.   Psychiatric/Behavioral: Positive for sleep disturbance, dysphoric mood, decreased concentration and agitation. Negative for suicidal ideas, hallucinations, behavioral problems, confusion and self-injury. The patient is nervous/anxious.        Objective:   Physical Exam  Constitutional: She is oriented to person, place, and time. She appears well-developed and well-nourished.  Neurological: She is alert and oriented to person, place, and time.  Psychiatric: Her behavior is normal. Thought content normal.  Tearful, neatly dressed           Assessment & Plan:  I think she would benefit tremendously from therapy, and she agreed. I gave her information about  contacting therapists and checking with her insurance for coverage. Some of her recent physical sx have been from coming off Lexapro too quickly, and these should resolve in the next week or so. I encouraged her to increase her Xanax to tid for the time being, and we will stay off antidepressants for awhile. She may benefit from a mood stabilizer like Lamictal at some point. She will follow up with me in one week.

## 2013-05-25 ENCOUNTER — Telehealth: Payer: Self-pay | Admitting: Family Medicine

## 2013-05-25 NOTE — Telephone Encounter (Signed)
Patient Information:  Caller Name: [name]  Phone: [phone]  Patient: Sierra Peters, Sierra Peters  Gender: Female  DOB: 03/17/1961  Age: 52 Years  PCP: Gershon Crane [facility])  Pregnant: No  Office Follow Up:  Does the office need to follow up with this patient?: Yes  Instructions For The Office: Please follow up with patient about anything else she may try.  Drug store is [facility] at [employer].  RN Note:  Patient notes that she started xanax as directed and feels like everything is being affected by her crying and fragile emotions.  She feels that she may be panicked by going into the weekend and the xanax not really helping but making her sleepy.  She is physically shaking but it is not cold.  She is wondering if there is anything else that can be tried as she goes into the weekend.  She has an appointment with Kendal Hymen 05/26/13 at 14:00.  Triaged using Anxiety-panic with a disposition to be seen within 24 hours for "unable to care for self or others or to perform ADLs".  Care advice given for disposition.  Patient declined appointment requesting a note be send to front office with her request, but also afraid that Dr. Clent Ridges will be upset with her since she just saw him 05/23/13.  She is willing to see him if needed but it needs to be after her appointment with Kendal Hymen tomorrow so she will not miss any more time from work.  RN will follow up with note to front office.  Symptoms  Reason For Call & Symptoms: Stopped Lexapro - cold [place] about 3 weeks.  Saw provider 09/02 and was placed on xanax; Appt to see Kendal Hymen tomorrow at 14:00.  She wishes she was dead but know she won't do anything to herself.  Reviewed Health History In EMR: Yes  Reviewed Medications In EMR: Yes  Reviewed Allergies In EMR: Yes  Reviewed Surgeries / Procedures: Yes  Date of Onset of Symptoms: 05/25/2013 OB / GYN:  LMP: 05/11/2013  Guideline(s) Used:  No Protocol Available - Sick Adult  Disposition Per Guideline:   Discuss with PCP and Callback by Nurse Today  Reason For Disposition Reached:   Nursing judgment  Advice Given:  Call Back If:  You become worse.  RN Overrode Recommendation:  Patient Requests Prescription  Patient declined appointment today with request to try any other medication suggestions Dr. Clent Ridges may have.  Is willing to come tomorrow afternoon after appointment with counselor.

## 2013-05-26 ENCOUNTER — Ambulatory Visit (INDEPENDENT_AMBULATORY_CARE_PROVIDER_SITE_OTHER): Payer: 59 | Admitting: Licensed Clinical Social Worker

## 2013-05-26 ENCOUNTER — Encounter: Payer: Self-pay | Admitting: Family Medicine

## 2013-05-26 ENCOUNTER — Ambulatory Visit (INDEPENDENT_AMBULATORY_CARE_PROVIDER_SITE_OTHER): Payer: 59 | Admitting: Family Medicine

## 2013-05-26 VITALS — BP 110/80 | Temp 98.6°F | Wt 107.0 lb

## 2013-05-26 DIAGNOSIS — F329 Major depressive disorder, single episode, unspecified: Secondary | ICD-10-CM

## 2013-05-26 DIAGNOSIS — F411 Generalized anxiety disorder: Secondary | ICD-10-CM

## 2013-05-26 DIAGNOSIS — F3189 Other bipolar disorder: Secondary | ICD-10-CM

## 2013-05-26 MED ORDER — OMEPRAZOLE 40 MG PO CPDR: 40.0000 mg | DELAYED_RELEASE_CAPSULE | Freq: Every day | ORAL | Status: DC

## 2013-05-26 MED ORDER — ESCITALOPRAM OXALATE 10 MG PO TABS: 10.0000 mg | ORAL_TABLET | Freq: Every day | ORAL | Status: DC

## 2013-05-26 MED ORDER — LAMOTRIGINE 25 MG PO TABS: 50.0000 mg | ORAL_TABLET | Freq: Every day | ORAL | Status: DC

## 2013-05-26 NOTE — Progress Notes (Signed)
  Subjective:    Patient ID: Sierra Peters, female    DOB: 09-26-1960, 52 y.o.   MRN: [id number]  HPI Here to follow up on anxiety and depression. We saw her a few days ago after she had taken herself off Lexapro 20 mg a day, and she was extremely anxious and frightened. We increased her Xanax to tid, and this has helped her calm down a little, however she feels sleepy all the time. She did meet with Sierra Peters today for her first therapy session, and this went very well. Sierra Peters has diagnosed her with Bipolar Disorder, and Sierra Peters has totally accepted this. She felt good about the therapy and she plans to work with Sierra Peters for the long term. She asks if she should go back on a lower dose of Lexapro.    Review of Systems  Constitutional: Negative.   Psychiatric/Behavioral: Positive for dysphoric mood, decreased concentration and agitation. Negative for suicidal ideas, hallucinations, confusion and self-injury. The patient is nervous/anxious. The patient is not hyperactive.        Objective:   Physical Exam  Constitutional: She appears well-developed and well-nourished. No distress.  Psychiatric: She has a normal mood and affect. Her behavior is normal. Thought content normal.  She looks much better today. More relaxed and smiling.           Assessment & Plan:  We will get back on Lexapro 10 mg a day (half the previous dose) and add Lamictal 50 mg daily. Decrease the Xanax back to once a day at bedtime. Recheck in 2 weeks

## 2013-05-26 NOTE — Telephone Encounter (Signed)
I see she is on our schedule to be seen this afternoon.

## 2013-05-30 ENCOUNTER — Ambulatory Visit: Payer: 59 | Admitting: Licensed Clinical Social Worker

## 2013-06-07 ENCOUNTER — Ambulatory Visit: Payer: 59 | Admitting: Licensed Clinical Social Worker

## 2013-06-09 ENCOUNTER — Ambulatory Visit: Payer: 59 | Admitting: Licensed Clinical Social Worker

## 2013-06-13 ENCOUNTER — Ambulatory Visit (INDEPENDENT_AMBULATORY_CARE_PROVIDER_SITE_OTHER): Payer: 59 | Admitting: Licensed Clinical Social Worker

## 2013-06-13 DIAGNOSIS — F3189 Other bipolar disorder: Secondary | ICD-10-CM

## 2013-06-22 ENCOUNTER — Ambulatory Visit (INDEPENDENT_AMBULATORY_CARE_PROVIDER_SITE_OTHER): Payer: 59 | Admitting: Licensed Clinical Social Worker

## 2013-06-22 DIAGNOSIS — F3189 Other bipolar disorder: Secondary | ICD-10-CM

## 2013-07-06 ENCOUNTER — Ambulatory Visit (INDEPENDENT_AMBULATORY_CARE_PROVIDER_SITE_OTHER): Payer: 59 | Admitting: Licensed Clinical Social Worker

## 2013-07-06 DIAGNOSIS — F3189 Other bipolar disorder: Secondary | ICD-10-CM

## 2013-07-11 ENCOUNTER — Ambulatory Visit (INDEPENDENT_AMBULATORY_CARE_PROVIDER_SITE_OTHER): Payer: 59 | Admitting: Licensed Clinical Social Worker

## 2013-07-11 DIAGNOSIS — F3189 Other bipolar disorder: Secondary | ICD-10-CM

## 2013-07-16 ENCOUNTER — Other Ambulatory Visit: Payer: Self-pay | Admitting: Family Medicine

## 2013-07-17 ENCOUNTER — Other Ambulatory Visit (INDEPENDENT_AMBULATORY_CARE_PROVIDER_SITE_OTHER): Payer: 59

## 2013-07-17 DIAGNOSIS — F3189 Other bipolar disorder: Secondary | ICD-10-CM

## 2013-07-17 NOTE — Telephone Encounter (Signed)
Pt would like to request refill by tues due to traveling to [place]. Pharm? [facility] [place]

## 2013-07-26 ENCOUNTER — Ambulatory Visit (INDEPENDENT_AMBULATORY_CARE_PROVIDER_SITE_OTHER): Payer: 59 | Admitting: Licensed Clinical Social Worker

## 2013-07-26 DIAGNOSIS — F3189 Other bipolar disorder: Secondary | ICD-10-CM

## 2013-07-27 ENCOUNTER — Other Ambulatory Visit: Payer: Self-pay

## 2013-08-09 ENCOUNTER — Ambulatory Visit: Payer: 59 | Admitting: Licensed Clinical Social Worker

## 2013-08-10 ENCOUNTER — Ambulatory Visit: Payer: 59 | Admitting: Licensed Clinical Social Worker

## 2013-08-23 ENCOUNTER — Telehealth: Payer: Self-pay | Admitting: Family Medicine

## 2013-08-23 ENCOUNTER — Ambulatory Visit (INDEPENDENT_AMBULATORY_CARE_PROVIDER_SITE_OTHER): Payer: 59 | Admitting: Licensed Clinical Social Worker

## 2013-08-23 DIAGNOSIS — F3189 Other bipolar disorder: Secondary | ICD-10-CM

## 2013-08-23 NOTE — Telephone Encounter (Signed)
Patient had an appointment with Sierra Peters and [name] requested that patient needs to increase dosage intake. These two med's: Escitalopram (LEXAPRO) 10 MG tablet needs to up it to 20 MG, and the lamoTRIgine (LAMICTAL) 25 MG tablet and the MG need to be increased. [employer].

## 2013-08-23 NOTE — Telephone Encounter (Signed)
I agree. Increase the Lexapro to 20 mg daily (call in #30 with 2 rf). Also increase Lamictal to 100 mg daily (call in #30 with 2 rf). Have her follow up with me in about a month

## 2013-08-24 ENCOUNTER — Ambulatory Visit: Payer: 59 | Admitting: Licensed Clinical Social Worker

## 2013-08-24 MED ORDER — ESCITALOPRAM OXALATE 20 MG PO TABS: 20.0000 mg | ORAL_TABLET | Freq: Every day | ORAL | Status: DC

## 2013-08-24 MED ORDER — LAMOTRIGINE 100 MG PO TABS: 100.0000 mg | ORAL_TABLET | Freq: Every day | ORAL | Status: DC

## 2013-08-24 NOTE — Telephone Encounter (Signed)
I sent both scripts e-scribe and left a voice message for pt. 

## 2013-09-06 ENCOUNTER — Ambulatory Visit: Payer: 59 | Admitting: Licensed Clinical Social Worker

## 2013-09-15 ENCOUNTER — Other Ambulatory Visit: Payer: Self-pay | Admitting: Family Medicine

## 2013-09-18 NOTE — Telephone Encounter (Signed)
Call in #90 with 5 rf 

## 2013-09-25 ENCOUNTER — Telehealth: Payer: Self-pay | Admitting: Family Medicine

## 2013-09-25 NOTE — Telephone Encounter (Signed)
It is probably the Lexapro, this is common. But since it is helping her I hope she does not stop it. Just eat less carbs and exercise and this will not happen

## 2013-09-25 NOTE — Telephone Encounter (Signed)
Pt would like you to look at her med list and see if there is one on there that could be causing pt to gain weight. (this happened after seeing Sierra Peters and being put on lexapro) pt has gained 10 lbs in last 6 mos to a year.

## 2013-09-26 NOTE — Telephone Encounter (Signed)
I would be hesitant to try Wellbutrin since it might make her anxiety worse. Lets stop Lexapro and try low dose Cymbalta at 20 mg daily. Call in #30 with 2 rf

## 2013-09-26 NOTE — Telephone Encounter (Signed)
I spoke with pt and she wants to know if you can recommend another medication that might be similar? Maybe Wellbutrin? She is concerned about the weight gain.

## 2013-09-27 MED ORDER — DULOXETINE HCL 20 MG PO CPEP: 20.0000 mg | ORAL_CAPSULE | Freq: Every day | ORAL | Status: DC

## 2013-09-27 NOTE — Telephone Encounter (Signed)
Called and spoke with pt and pt is aware of recommendations.  Rx sent to pharmacy and pt is aware.

## 2013-09-29 ENCOUNTER — Telehealth: Payer: Self-pay | Admitting: Family Medicine

## 2013-09-29 NOTE — Telephone Encounter (Signed)
I spoke with pt  

## 2013-09-29 NOTE — Telephone Encounter (Signed)
I want her to take only one dose of Cymbalta a day to get started. If she is not feeling better in 2 weeks then she can go to twice a day

## 2013-09-29 NOTE — Telephone Encounter (Signed)
Office please review and call pt back regarding Cymbalta dosage:  Pt is calling and states that she was changed from Lexapro to Cymbalta on 09/27/13; pt believes that MD stated for her to "double up" on the Cymbalta for a while but when she picked up the Cymbalta it is only 1 tab per day. Explained to pt that there is no notes in the chart referencing to "double up" but a message will be sent for MD to review for clarification and office will get back in touch with her on the clarification; voices understanding;

## 2013-10-10 ENCOUNTER — Telehealth: Payer: Self-pay | Admitting: Family Medicine

## 2013-10-10 NOTE — Telephone Encounter (Signed)
Pt would like md to accept her son as new pt. Can I sch? °

## 2013-10-11 NOTE — Telephone Encounter (Signed)
Sorry but I cannot take him on, thanks

## 2013-10-11 NOTE — Telephone Encounter (Signed)
Pt  Is aware waiting on md

## 2013-10-12 NOTE — Telephone Encounter (Signed)
Pt mom is aware 

## 2013-11-22 ENCOUNTER — Telehealth: Payer: Self-pay | Admitting: Family Medicine

## 2013-11-22 NOTE — Telephone Encounter (Signed)
[facility] - [place], [place] - [address] requesting new scripts for the following:  meloxicam (MOBIC) 15 MG tablet omeprazole (PRILOSEC) 40 MG capsule

## 2013-11-22 NOTE — Telephone Encounter (Signed)
[facility] - [place], [place] - [address] requesting new script for meloxicam (MOBIC) 15 MG tablet

## 2013-11-23 MED ORDER — OMEPRAZOLE 40 MG PO CPDR: 40.0000 mg | DELAYED_RELEASE_CAPSULE | Freq: Every day | ORAL | Status: DC

## 2013-11-23 MED ORDER — MELOXICAM 15 MG PO TABS: ORAL_TABLET | ORAL | Status: DC

## 2013-11-23 NOTE — Telephone Encounter (Signed)
I sent both scripts e-scribe. 

## 2013-11-27 ENCOUNTER — Encounter: Payer: Self-pay | Admitting: Family Medicine

## 2013-11-27 ENCOUNTER — Ambulatory Visit (INDEPENDENT_AMBULATORY_CARE_PROVIDER_SITE_OTHER): Payer: 59 | Admitting: Family Medicine

## 2013-11-27 ENCOUNTER — Other Ambulatory Visit: Payer: Self-pay | Admitting: Family Medicine

## 2013-11-27 VITALS — BP 100/70 | Temp 99.1°F | Wt 109.0 lb

## 2013-11-27 DIAGNOSIS — K219 Gastro-esophageal reflux disease without esophagitis: Secondary | ICD-10-CM

## 2013-11-27 MED ORDER — ESOMEPRAZOLE MAGNESIUM 40 MG PO CPDR: 40.0000 mg | DELAYED_RELEASE_CAPSULE | Freq: Every day | ORAL | Status: DC

## 2013-11-27 NOTE — Progress Notes (Signed)
Chief Complaint  Patient presents with  . severe abdominal pain    HPI:  Sierra Peters, a 52 pt of Dr. Clent Ridges with PMH chronic fatigue, GERD, chronic heartburn on prilosec and mobic, depression and anxiety is here for an acute visit for:  Abd Pain: -chronic heart burn related to her acid reflux - has had this heartburn for a long time on and off and has been this severe for a long time on and off -dexilant was the only thing that helped but she can not afford this -hx of gastritis in the stomach - has had EGD in the past -feels like food gets stuck sometime in the lower esophagus -taking omeprazole once daily and tums for intermittent symptoms -denies: fevers, vomiting, diarrhea, change in bowels, hematochezia, melena, travel, abx -sees Dr. Ewing Schlein in GI -drinks wine and soda -wants to try nexium  ROS: See pertinent positives and negatives per HPI.  Past Medical History  Diagnosis Date  . Headache(784.0)   . MVP (mitral valve prolapse)   . Allergy     sees Dr. Corinda Gubler  . Depression   . Chronic fatigue   . Anxiety     Past Surgical History  Procedure Laterality Date  . Appendectomy    . Tubal ligation    . Breast surgery      augmentation per Dr. Stephens November    Family History  Problem Relation Age of Onset  . Arthritis    . Heart disease      cardiovascular disorder    History   Social History  . Marital Status: Married    Spouse Name: N/A    Number of Children: N/A  . Years of Education: N/A   Social History Main Topics  . Smoking status: Never Smoker   . Smokeless tobacco: Never Used  . Alcohol Use: 1.0 oz/week    2 drink(s) per week  . Drug Use: No  . Sexual Activity: None   Other Topics Concern  . None   Social History Narrative  . None    Current outpatient prescriptions:ALPRAZolam (XANAX) 0.5 MG tablet, Take 1 tablet (0.5 mg total) by mouth 3 (three) times daily as needed for anxiety., Disp: 90 tablet, Rfl: 5;  desloratadine-pseudoephedrine  (CLARINEX-D 24-HOUR) 5-240 MG per 24 hr tablet, Take 1 tablet by mouth daily. , Disp: , Rfl: ;  DULoxetine (CYMBALTA) 20 MG capsule, Take 1 capsule (20 mg total) by mouth daily., Disp: 30 capsule, Rfl: 2 lamoTRIgine (LAMICTAL) 100 MG tablet, Take 1 tablet (100 mg total) by mouth daily., Disp: 30 tablet, Rfl: 2;  meloxicam (MOBIC) 15 MG tablet, TAKE 1 TABLET ONCE DAILY., Disp: 90 tablet, Rfl: 0;  minocycline (DYNACIN) 100 MG tablet, Take 100 mg by mouth once.  , Disp: , Rfl: ;  esomeprazole (NEXIUM) 40 MG capsule, Take 1 capsule (40 mg total) by mouth daily., Disp: 30 capsule, Rfl: 0 omeprazole (PRILOSEC) 40 MG capsule, Take 1 capsule (40 mg total) by mouth daily., Disp: 90 capsule, Rfl: 0  EXAM:  Filed Vitals:   11/27/13 1550  BP: 100/70  Temp: 99.1 F (37.3 C)    Body mass index is 19.31 kg/(m^2).  GENERAL: vitals reviewed and listed above, alert, oriented, appears well hydrated and in no acute distress  HEENT: atraumatic, conjunttiva clear, no obvious abnormalities on inspection of external nose and ears  NECK: no obvious masses on inspection  LUNGS: clear to auscultation bilaterally, no wheezes, rales or rhonchi, good air movement  CV: HRRR, no peripheral  edema  ABD: BS, soft, NTTP  MS: moves all extremities without noticeable abnormality  PSYCH: pleasant and cooperative, no obvious depression or anxiety  ASSESSMENT AND PLAN:  Discussed the following assessment and plan:  GERD (gastroesophageal reflux disease) - Plan: esomeprazole (NEXIUM) 40 MG capsule  -we discussed possible serious and likely etiologies, workup and treatment, treatment risks and return precautions -after this discussion, Sierra opted for trial nexium, GERD diet and follow up with her gastroenterologist as may need repeat EGD -of course, we advised Sierra  to return or notify a doctor immediately if symptoms worsen or persist or new concerns arise. -Patient advised to return or notify a doctor immediately  if symptoms worsen or persist or new concerns arise.  Patient Instructions  Diet for Gastroesophageal Reflux Disease, Adult  Trial of nexium instead of prilosec  Follow up with your gastroenterologist Reflux (acid reflux) is when acid from your stomach flows up into the esophagus. When acid comes in contact with the esophagus, the acid causes irritation and soreness (inflammation) in the esophagus. When reflux happens often or so severely that it causes damage to the esophagus, it is called gastroesophageal reflux disease (GERD). Nutrition therapy can help ease the discomfort of GERD. FOODS OR DRINKS TO AVOID OR LIMIT  Smoking or chewing tobacco. Nicotine is one of the most potent stimulants to acid production in the gastrointestinal tract.  Caffeinated and decaffeinated coffee and black tea.  Regular or low-calorie carbonated beverages or energy drinks (caffeine-free carbonated beverages are allowed).   Strong spices, such as black pepper, white pepper, red pepper, cayenne, curry powder, and chili powder.  Peppermint or spearmint.  Chocolate.  High-fat foods, including meats and fried foods. Extra added fats including oils, butter, salad dressings, and nuts. Limit these to less than 8 tsp per day.  Fruits and vegetables if they are not tolerated, such as citrus fruits or tomatoes.  Alcohol.  Any food that seems to aggravate your condition. If you have questions regarding your diet, call your caregiver or a registered dietitian. OTHER THINGS THAT MAY HELP GERD INCLUDE:   Eating your meals slowly, in a relaxed setting.  Eating 5 to 6 small meals per day instead of 3 large meals.  Eliminating food for a period of time if it causes distress.  Not lying down until 3 hours after eating a meal.  Keeping the head of your bed raised 6 to 9 inches (15 to 23 cm) by using a foam wedge or blocks under the legs of the bed. Lying flat may make symptoms worse.  Being physically active.  Weight loss may be helpful in reducing reflux in overweight or obese adults.  Wear loose fitting clothing EXAMPLE MEAL PLAN This meal plan is approximately 2,000 calories based on https://www.[place].org/ChooseMyPlate.gov meal planning guidelines. Breakfast   cup cooked oatmeal.  1 cup strawberries.  1 cup low-fat milk.  1 oz almonds. Snack  1 cup cucumber slices.  6 oz yogurt (made from low-fat or fat-free milk). Lunch  2 slice whole-wheat bread.  2 oz sliced [place]urkey.  2 tsp mayonnaise.  1 cup blueberries.  1 cup snap peas. Snack  6 whole-wheat crackers.  1 oz string cheese. Dinner   cup brown rice.  1 cup mixed veggies.  1 tsp olive oil.  3 oz grilled fish. Document Released: 09/07/2005 Document Revised: 11/30/2011 Document Reviewed: 07/24/2011 [facility] Patient Information 2014 [place], [employer].      KIM, Dahlia ClientHANNAH R.

## 2013-11-27 NOTE — Progress Notes (Signed)
Pre visit review using our clinic review tool, if applicable. No additional management support is needed unless otherwise documented below in the visit note. 

## 2013-11-27 NOTE — Patient Instructions (Addendum)
Diet for Gastroesophageal Reflux Disease, Adult  Trial of nexium instead of prilosec  Follow up with your gastroenterologist Reflux (acid reflux) is when acid from your stomach flows up into the esophagus. When acid comes in contact with the esophagus, the acid causes irritation and soreness (inflammation) in the esophagus. When reflux happens often or so severely that it causes damage to the esophagus, it is called gastroesophageal reflux disease (GERD). Nutrition therapy can help ease the discomfort of GERD. FOODS OR DRINKS TO AVOID OR LIMIT  Smoking or chewing tobacco. Nicotine is one of the most potent stimulants to acid production in the gastrointestinal tract.  Caffeinated and decaffeinated coffee and black tea.  Regular or low-calorie carbonated beverages or energy drinks (caffeine-free carbonated beverages are allowed).   Strong spices, such as black pepper, white pepper, red pepper, cayenne, curry powder, and chili powder.  Peppermint or spearmint.  Chocolate.  High-fat foods, including meats and fried foods. Extra added fats including oils, butter, salad dressings, and nuts. Limit these to less than 8 tsp per day.  Fruits and vegetables if they are not tolerated, such as citrus fruits or tomatoes.  Alcohol.  Any food that seems to aggravate your condition. If you have questions regarding your diet, call your caregiver or a registered dietitian. OTHER THINGS THAT MAY HELP GERD INCLUDE:   Eating your meals slowly, in a relaxed setting.  Eating 5 to 6 small meals per day instead of 3 large meals.  Eliminating food for a period of time if it causes distress.  Not lying down until 3 hours after eating a meal.  Keeping the head of your bed raised 6 to 9 inches (15 to 23 cm) by using a foam wedge or blocks under the legs of the bed. Lying flat may make symptoms worse.  Being physically active. Weight loss may be helpful in reducing reflux in overweight or obese  adults.  Wear loose fitting clothing EXAMPLE MEAL PLAN This meal plan is approximately 2,000 calories based on https://www.[place].org/ChooseMyPlate.gov meal planning guidelines. Breakfast   cup cooked oatmeal.  1 cup strawberries.  1 cup low-fat milk.  1 oz almonds. Snack  1 cup cucumber slices.  6 oz yogurt (made from low-fat or fat-free milk). Lunch  2 slice whole-wheat bread.  2 oz sliced [place]urkey.  2 tsp mayonnaise.  1 cup blueberries.  1 cup snap peas. Snack  6 whole-wheat crackers.  1 oz string cheese. Dinner   cup brown rice.  1 cup mixed veggies.  1 tsp olive oil.  3 oz grilled fish. Document Released: 09/07/2005 Document Revised: 11/30/2011 Document Reviewed: 07/24/2011 [facility] Patient Information 2014 [place] ParkExitCare, MarylandLLC.

## 2013-11-27 NOTE — Telephone Encounter (Signed)
Also pt wants to go back on Lexapro, she has been on Cymbalta.

## 2013-11-28 ENCOUNTER — Telehealth: Payer: Self-pay | Admitting: Family Medicine

## 2013-11-28 NOTE — Telephone Encounter (Signed)
Pt would like to go back to lexapro. Pt is very depressed. Pt dr saw dr Selena Battenkim yesterday. [place] [facility]

## 2013-11-29 MED ORDER — ESCITALOPRAM OXALATE 20 MG PO TABS: 20.0000 mg | ORAL_TABLET | Freq: Every day | ORAL | Status: DC

## 2013-11-29 NOTE — Telephone Encounter (Signed)
I sent script e-scribe and spoke with pt. 

## 2013-11-29 NOTE — Telephone Encounter (Signed)
Stop Cymbalta and switch to Lexapro 20 mg daily. Call in #30 with 2 rf.

## 2013-12-27 ENCOUNTER — Other Ambulatory Visit (HOSPITAL_COMMUNITY): Payer: Self-pay | Admitting: Obstetrics

## 2013-12-27 DIAGNOSIS — Z1231 Encounter for screening mammogram for malignant neoplasm of breast: Secondary | ICD-10-CM

## 2014-01-10 ENCOUNTER — Other Ambulatory Visit (HOSPITAL_COMMUNITY): Payer: Self-pay | Admitting: Obstetrics

## 2014-01-10 ENCOUNTER — Ambulatory Visit (HOSPITAL_COMMUNITY)
Admission: RE | Admit: 2014-01-10 | Discharge: 2014-01-10 | Disposition: A | Discharge status: Home / Self Care | Payer: 59 | Source: Ambulatory Visit | Attending: Obstetrics | Admitting: Obstetrics

## 2014-01-10 DIAGNOSIS — Z1231 Encounter for screening mammogram for malignant neoplasm of breast: Secondary | ICD-10-CM

## 2014-02-08 ENCOUNTER — Other Ambulatory Visit: Payer: Self-pay | Admitting: Family Medicine

## 2014-03-06 ENCOUNTER — Other Ambulatory Visit: Payer: Self-pay | Admitting: Family Medicine

## 2014-04-28 ENCOUNTER — Other Ambulatory Visit: Payer: Self-pay | Admitting: Family Medicine

## 2014-04-30 NOTE — Telephone Encounter (Signed)
Call in #90 with 5 rf 

## 2014-05-15 ENCOUNTER — Other Ambulatory Visit: Payer: Self-pay | Admitting: Family Medicine

## 2014-06-23 ENCOUNTER — Other Ambulatory Visit: Payer: Self-pay | Admitting: Family Medicine

## 2014-07-06 ENCOUNTER — Other Ambulatory Visit: Payer: Self-pay

## 2014-09-03 ENCOUNTER — Other Ambulatory Visit: Payer: Self-pay

## 2014-09-03 NOTE — Telephone Encounter (Signed)
Rx request for:   escitalopram 20 mg tablet  Lamotrigine 100 mg  Pharm:  [facility]  Pls advise.

## 2014-09-04 MED ORDER — LAMOTRIGINE 100 MG PO TABS: 100.0000 mg | ORAL_TABLET | Freq: Every day | ORAL | Status: DC

## 2014-09-04 MED ORDER — ESCITALOPRAM OXALATE 20 MG PO TABS: 20.0000 mg | ORAL_TABLET | Freq: Every day | ORAL | Status: DC

## 2014-09-04 NOTE — Telephone Encounter (Signed)
Refill both for 90 days only. She needs an OV after that

## 2014-09-04 NOTE — Telephone Encounter (Signed)
Rx's sent to [facility].  

## 2014-09-06 ENCOUNTER — Telehealth: Payer: Self-pay

## 2014-09-06 NOTE — Telephone Encounter (Signed)
What is "[facility]", who gave it to her, and what is it for?

## 2014-09-06 NOTE — Telephone Encounter (Signed)
Left message on voicemail to call office.  

## 2014-09-06 NOTE — Telephone Encounter (Signed)
Rx request for:   Lamotrigine 100 mg interacts with Duavee.  Pls advise.

## 2014-09-07 NOTE — Telephone Encounter (Signed)
Per Dr. Clent RidgesFry it is okay to take together and the form was faxed back to [facility], also spoke with pt.

## 2014-11-09 ENCOUNTER — Other Ambulatory Visit: Payer: Self-pay | Admitting: Family Medicine

## 2015-01-05 ENCOUNTER — Other Ambulatory Visit: Payer: Self-pay | Admitting: Family Medicine

## 2015-01-07 NOTE — Telephone Encounter (Signed)
Call in #90 only. She needs an OV soon  

## 2015-04-10 ENCOUNTER — Other Ambulatory Visit: Payer: Self-pay | Admitting: Family Medicine

## 2015-04-11 ENCOUNTER — Encounter: Payer: Self-pay | Admitting: Family Medicine

## 2015-04-11 ENCOUNTER — Ambulatory Visit (INDEPENDENT_AMBULATORY_CARE_PROVIDER_SITE_OTHER): Payer: BLUE CROSS/BLUE SHIELD | Admitting: Family Medicine

## 2015-04-11 VITALS — BP 130/95 | HR 70 | Temp 98.2°F | Ht 63.0 in | Wt 103.0 lb

## 2015-04-11 DIAGNOSIS — F329 Major depressive disorder, single episode, unspecified: Secondary | ICD-10-CM

## 2015-04-11 DIAGNOSIS — F411 Generalized anxiety disorder: Secondary | ICD-10-CM | POA: Diagnosis not present

## 2015-04-11 DIAGNOSIS — F32A Depression, unspecified: Secondary | ICD-10-CM

## 2015-04-11 DIAGNOSIS — R030 Elevated blood-pressure reading, without diagnosis of hypertension: Secondary | ICD-10-CM | POA: Diagnosis not present

## 2015-04-11 DIAGNOSIS — IMO0001 Reserved for inherently not codable concepts without codable children: Secondary | ICD-10-CM

## 2015-04-11 MED ORDER — ALPRAZOLAM 0.5 MG PO TABS: ORAL_TABLET | ORAL | Status: DC

## 2015-04-11 NOTE — Progress Notes (Signed)
   Subjective:    Patient ID: Sierra Peters, female    DOB: January 08, 1961, 54 y.o.   MRN: [id number]  HPI Here to follow up on anxiety and to ask questions about BP. She as always had normal BP readings until about 3 months ago. Her work has a Publishing rights manager on site and she has had her BP checked a few times. Each time she was told it was "a little high" but she is not sure what the readings were. She has always watched her diet closely and has exercised daily. She feels well. Her anxiety has been well controlled.    Review of Systems  Constitutional: Negative.   Respiratory: Negative.   Cardiovascular: Negative.   Neurological: Negative.   Psychiatric/Behavioral: Negative.        Objective:   Physical Exam  Constitutional: She is oriented to person, place, and time. She appears well-developed and well-nourished.  Neck: No thyromegaly present.  Cardiovascular: Normal rate, regular rhythm, normal heart sounds and intact distal pulses.   Pulmonary/Chest: Effort normal and breath sounds normal.  Lymphadenopathy:    She has no cervical adenopathy.  Neurological: She is alert and oriented to person, place, and time.  Psychiatric: She has a normal mood and affect. Her behavior is normal. Thought content normal.          Assessment & Plan:  As far as the BP goes, she will watch this closely and let me know how she is doing. Advised her to limit her sodium intake to less than 2000 mg a day. I refilled her Xanax today.

## 2015-04-11 NOTE — Progress Notes (Signed)
Pre visit review using our clinic review tool, if applicable. No additional management support is needed unless otherwise documented below in the visit note. 

## 2015-04-12 ENCOUNTER — Other Ambulatory Visit: Payer: Self-pay | Admitting: Family Medicine

## 2015-04-29 ENCOUNTER — Telehealth: Payer: Self-pay | Admitting: Family Medicine

## 2015-04-29 NOTE — Telephone Encounter (Signed)
Pt said she saw Dr Clent Ridges about a month ago and he told her to try some over the counter meds for sleeping and she call today to say that none of that is working. Pt said Dr Clent Ridges told her if none of the over counter med work to call back and he would call her in a rx for  Ambien. Pt is calling today to ask for that med. I did let her know that he was out of the office.

## 2015-04-30 ENCOUNTER — Other Ambulatory Visit: Payer: Self-pay | Admitting: Gastroenterology

## 2015-04-30 DIAGNOSIS — R1084 Generalized abdominal pain: Secondary | ICD-10-CM

## 2015-05-06 ENCOUNTER — Ambulatory Visit
Admission: RE | Admit: 2015-05-06 | Discharge: 2015-05-06 | Disposition: A | Discharge status: Home / Self Care | Payer: BLUE CROSS/BLUE SHIELD | Source: Ambulatory Visit | Attending: Gastroenterology | Admitting: Gastroenterology

## 2015-05-06 DIAGNOSIS — R1084 Generalized abdominal pain: Secondary | ICD-10-CM

## 2015-05-06 NOTE — Telephone Encounter (Signed)
Pt following up on request for sleep med. [place]

## 2015-05-07 NOTE — Telephone Encounter (Signed)
Call in Zolpidem 10 mg qhs #30 with 5 rf 

## 2015-05-08 MED ORDER — ZOLPIDEM TARTRATE 10 MG PO TABS: 10.0000 mg | ORAL_TABLET | Freq: Every evening | ORAL | Status: DC | PRN

## 2015-05-08 NOTE — Telephone Encounter (Signed)
I called in script and spoke with pt. 

## 2015-05-08 NOTE — Addendum Note (Signed)
Addended by: Aniceto Boss A on: 05/08/2015 10:47 AM   Modules accepted: Orders

## 2015-11-06 ENCOUNTER — Telehealth: Payer: Self-pay | Admitting: Family Medicine

## 2015-11-06 NOTE — Telephone Encounter (Signed)
Call in #30 with 5 rf 

## 2015-11-06 NOTE — Telephone Encounter (Signed)
Refill request for Zolpidem 10 mg take 1 po qhs prn and send to [facility].

## 2015-11-07 NOTE — Telephone Encounter (Signed)
Rx called in as directed.   

## 2015-12-03 ENCOUNTER — Other Ambulatory Visit: Payer: Self-pay | Admitting: Family Medicine

## 2015-12-03 NOTE — Telephone Encounter (Signed)
Call in #90 with 5 rf 

## 2015-12-23 DIAGNOSIS — Z5189 Encounter for other specified aftercare: Secondary | ICD-10-CM | POA: Diagnosis not present

## 2015-12-25 DIAGNOSIS — Z5189 Encounter for other specified aftercare: Secondary | ICD-10-CM | POA: Diagnosis not present

## 2015-12-30 DIAGNOSIS — Z5189 Encounter for other specified aftercare: Secondary | ICD-10-CM | POA: Diagnosis not present

## 2016-01-01 DIAGNOSIS — Z5189 Encounter for other specified aftercare: Secondary | ICD-10-CM | POA: Diagnosis not present

## 2016-01-07 DIAGNOSIS — J3089 Other allergic rhinitis: Secondary | ICD-10-CM | POA: Diagnosis not present

## 2016-01-10 DIAGNOSIS — Z5189 Encounter for other specified aftercare: Secondary | ICD-10-CM | POA: Diagnosis not present

## 2016-01-13 DIAGNOSIS — Z5189 Encounter for other specified aftercare: Secondary | ICD-10-CM | POA: Diagnosis not present

## 2016-01-15 DIAGNOSIS — Z5189 Encounter for other specified aftercare: Secondary | ICD-10-CM | POA: Diagnosis not present

## 2016-01-20 DIAGNOSIS — Z5189 Encounter for other specified aftercare: Secondary | ICD-10-CM | POA: Diagnosis not present

## 2016-01-22 DIAGNOSIS — Z5189 Encounter for other specified aftercare: Secondary | ICD-10-CM | POA: Diagnosis not present

## 2016-01-29 DIAGNOSIS — Z5189 Encounter for other specified aftercare: Secondary | ICD-10-CM | POA: Diagnosis not present

## 2016-02-03 DIAGNOSIS — F329 Major depressive disorder, single episode, unspecified: Secondary | ICD-10-CM | POA: Diagnosis not present

## 2016-02-04 DIAGNOSIS — R87810 Cervical high risk human papillomavirus (HPV) DNA test positive: Secondary | ICD-10-CM | POA: Diagnosis not present

## 2016-02-04 DIAGNOSIS — N889 Noninflammatory disorder of cervix uteri, unspecified: Secondary | ICD-10-CM | POA: Diagnosis not present

## 2016-02-04 DIAGNOSIS — B977 Papillomavirus as the cause of diseases classified elsewhere: Secondary | ICD-10-CM | POA: Diagnosis not present

## 2016-02-05 DIAGNOSIS — Z5189 Encounter for other specified aftercare: Secondary | ICD-10-CM | POA: Diagnosis not present

## 2016-02-12 DIAGNOSIS — Z5189 Encounter for other specified aftercare: Secondary | ICD-10-CM | POA: Diagnosis not present

## 2016-02-19 DIAGNOSIS — Z5189 Encounter for other specified aftercare: Secondary | ICD-10-CM | POA: Diagnosis not present

## 2016-02-26 DIAGNOSIS — Z5189 Encounter for other specified aftercare: Secondary | ICD-10-CM | POA: Diagnosis not present

## 2016-02-28 DIAGNOSIS — R499 Unspecified voice and resonance disorder: Secondary | ICD-10-CM | POA: Diagnosis not present

## 2016-02-28 DIAGNOSIS — R0602 Shortness of breath: Secondary | ICD-10-CM | POA: Diagnosis not present

## 2016-02-28 DIAGNOSIS — J3089 Other allergic rhinitis: Secondary | ICD-10-CM | POA: Diagnosis not present

## 2016-02-28 DIAGNOSIS — J019 Acute sinusitis, unspecified: Secondary | ICD-10-CM | POA: Diagnosis not present

## 2016-03-04 DIAGNOSIS — Z5189 Encounter for other specified aftercare: Secondary | ICD-10-CM | POA: Diagnosis not present

## 2016-03-05 DIAGNOSIS — K219 Gastro-esophageal reflux disease without esophagitis: Secondary | ICD-10-CM | POA: Diagnosis not present

## 2016-03-05 DIAGNOSIS — F329 Major depressive disorder, single episode, unspecified: Secondary | ICD-10-CM | POA: Diagnosis not present

## 2016-03-05 DIAGNOSIS — J309 Allergic rhinitis, unspecified: Secondary | ICD-10-CM | POA: Diagnosis not present

## 2016-03-05 DIAGNOSIS — M19041 Primary osteoarthritis, right hand: Secondary | ICD-10-CM | POA: Diagnosis not present

## 2016-03-11 DIAGNOSIS — Z5189 Encounter for other specified aftercare: Secondary | ICD-10-CM | POA: Diagnosis not present

## 2016-03-18 DIAGNOSIS — Z5189 Encounter for other specified aftercare: Secondary | ICD-10-CM | POA: Diagnosis not present

## 2016-03-25 DIAGNOSIS — Z5189 Encounter for other specified aftercare: Secondary | ICD-10-CM | POA: Diagnosis not present

## 2016-04-01 DIAGNOSIS — Z5189 Encounter for other specified aftercare: Secondary | ICD-10-CM | POA: Diagnosis not present

## 2016-04-08 DIAGNOSIS — Z5189 Encounter for other specified aftercare: Secondary | ICD-10-CM | POA: Diagnosis not present

## 2016-04-15 DIAGNOSIS — Z5189 Encounter for other specified aftercare: Secondary | ICD-10-CM | POA: Diagnosis not present

## 2016-04-16 DIAGNOSIS — Z0000 Encounter for general adult medical examination without abnormal findings: Secondary | ICD-10-CM | POA: Diagnosis not present

## 2016-04-22 DIAGNOSIS — J343 Hypertrophy of nasal turbinates: Secondary | ICD-10-CM | POA: Diagnosis not present

## 2016-04-22 DIAGNOSIS — Z5189 Encounter for other specified aftercare: Secondary | ICD-10-CM | POA: Diagnosis not present

## 2016-04-22 DIAGNOSIS — J3089 Other allergic rhinitis: Secondary | ICD-10-CM | POA: Diagnosis not present

## 2016-04-22 DIAGNOSIS — J310 Chronic rhinitis: Secondary | ICD-10-CM | POA: Diagnosis not present

## 2016-04-22 DIAGNOSIS — R490 Dysphonia: Secondary | ICD-10-CM | POA: Diagnosis not present

## 2016-04-29 DIAGNOSIS — Z5189 Encounter for other specified aftercare: Secondary | ICD-10-CM | POA: Diagnosis not present

## 2016-04-29 DIAGNOSIS — J3089 Other allergic rhinitis: Secondary | ICD-10-CM | POA: Diagnosis not present

## 2016-05-02 ENCOUNTER — Other Ambulatory Visit: Payer: Self-pay | Admitting: Family Medicine

## 2016-05-06 DIAGNOSIS — J3089 Other allergic rhinitis: Secondary | ICD-10-CM | POA: Diagnosis not present

## 2016-05-06 DIAGNOSIS — Z5189 Encounter for other specified aftercare: Secondary | ICD-10-CM | POA: Diagnosis not present

## 2016-05-08 DIAGNOSIS — J3089 Other allergic rhinitis: Secondary | ICD-10-CM | POA: Diagnosis not present

## 2016-05-08 NOTE — Telephone Encounter (Signed)
Call in #30 with 5 rf 

## 2016-05-13 DIAGNOSIS — Z5189 Encounter for other specified aftercare: Secondary | ICD-10-CM | POA: Diagnosis not present

## 2016-05-15 DIAGNOSIS — J343 Hypertrophy of nasal turbinates: Secondary | ICD-10-CM | POA: Diagnosis not present

## 2016-05-15 DIAGNOSIS — J3489 Other specified disorders of nose and nasal sinuses: Secondary | ICD-10-CM | POA: Diagnosis not present

## 2016-06-04 DIAGNOSIS — E559 Vitamin D deficiency, unspecified: Secondary | ICD-10-CM | POA: Diagnosis not present

## 2016-06-04 DIAGNOSIS — F329 Major depressive disorder, single episode, unspecified: Secondary | ICD-10-CM | POA: Diagnosis not present

## 2016-06-04 DIAGNOSIS — K219 Gastro-esophageal reflux disease without esophagitis: Secondary | ICD-10-CM | POA: Diagnosis not present

## 2016-06-04 DIAGNOSIS — R140 Abdominal distension (gaseous): Secondary | ICD-10-CM | POA: Diagnosis not present

## 2016-06-04 DIAGNOSIS — N959 Unspecified menopausal and perimenopausal disorder: Secondary | ICD-10-CM | POA: Diagnosis not present

## 2016-06-04 DIAGNOSIS — Z9109 Other allergy status, other than to drugs and biological substances: Secondary | ICD-10-CM | POA: Diagnosis not present

## 2016-06-05 DIAGNOSIS — K219 Gastro-esophageal reflux disease without esophagitis: Secondary | ICD-10-CM | POA: Diagnosis not present

## 2016-06-05 DIAGNOSIS — J309 Allergic rhinitis, unspecified: Secondary | ICD-10-CM | POA: Diagnosis not present

## 2016-06-05 DIAGNOSIS — M19041 Primary osteoarthritis, right hand: Secondary | ICD-10-CM | POA: Diagnosis not present

## 2016-06-05 DIAGNOSIS — F329 Major depressive disorder, single episode, unspecified: Secondary | ICD-10-CM | POA: Diagnosis not present

## 2016-06-10 DIAGNOSIS — Z5189 Encounter for other specified aftercare: Secondary | ICD-10-CM | POA: Diagnosis not present

## 2016-06-10 DIAGNOSIS — H938X3 Other specified disorders of ear, bilateral: Secondary | ICD-10-CM | POA: Diagnosis not present

## 2016-06-17 DIAGNOSIS — Z516 Encounter for desensitization to allergens: Secondary | ICD-10-CM | POA: Diagnosis not present

## 2016-06-18 DIAGNOSIS — H53481 Generalized contraction of visual field, right eye: Secondary | ICD-10-CM | POA: Diagnosis not present

## 2016-06-18 DIAGNOSIS — H02834 Dermatochalasis of left upper eyelid: Secondary | ICD-10-CM | POA: Diagnosis not present

## 2016-06-18 DIAGNOSIS — H02831 Dermatochalasis of right upper eyelid: Secondary | ICD-10-CM | POA: Diagnosis not present

## 2016-06-18 DIAGNOSIS — H53482 Generalized contraction of visual field, left eye: Secondary | ICD-10-CM | POA: Diagnosis not present

## 2016-06-22 ENCOUNTER — Other Ambulatory Visit: Payer: Self-pay | Admitting: Family Medicine

## 2016-06-23 NOTE — Telephone Encounter (Signed)
Call in #90 with 5 rf 

## 2016-06-24 DIAGNOSIS — Z5189 Encounter for other specified aftercare: Secondary | ICD-10-CM | POA: Diagnosis not present

## 2016-07-01 DIAGNOSIS — Z9109 Other allergy status, other than to drugs and biological substances: Secondary | ICD-10-CM | POA: Diagnosis not present

## 2016-07-01 DIAGNOSIS — Z5189 Encounter for other specified aftercare: Secondary | ICD-10-CM | POA: Diagnosis not present

## 2016-07-01 DIAGNOSIS — K219 Gastro-esophageal reflux disease without esophagitis: Secondary | ICD-10-CM | POA: Diagnosis not present

## 2016-07-01 DIAGNOSIS — R140 Abdominal distension (gaseous): Secondary | ICD-10-CM | POA: Diagnosis not present

## 2016-07-01 DIAGNOSIS — F329 Major depressive disorder, single episode, unspecified: Secondary | ICD-10-CM | POA: Diagnosis not present

## 2016-07-08 DIAGNOSIS — Z5189 Encounter for other specified aftercare: Secondary | ICD-10-CM | POA: Diagnosis not present

## 2016-07-15 DIAGNOSIS — Z5189 Encounter for other specified aftercare: Secondary | ICD-10-CM | POA: Diagnosis not present

## 2016-07-22 DIAGNOSIS — Z5189 Encounter for other specified aftercare: Secondary | ICD-10-CM | POA: Diagnosis not present

## 2016-07-29 DIAGNOSIS — Z516 Encounter for desensitization to allergens: Secondary | ICD-10-CM | POA: Diagnosis not present

## 2016-08-05 DIAGNOSIS — Z5189 Encounter for other specified aftercare: Secondary | ICD-10-CM | POA: Diagnosis not present

## 2016-08-12 DIAGNOSIS — Z5189 Encounter for other specified aftercare: Secondary | ICD-10-CM | POA: Diagnosis not present

## 2016-08-19 DIAGNOSIS — Z5189 Encounter for other specified aftercare: Secondary | ICD-10-CM | POA: Diagnosis not present

## 2016-08-26 DIAGNOSIS — Z5189 Encounter for other specified aftercare: Secondary | ICD-10-CM | POA: Diagnosis not present

## 2016-08-26 DIAGNOSIS — Z0000 Encounter for general adult medical examination without abnormal findings: Secondary | ICD-10-CM | POA: Diagnosis not present

## 2016-08-28 DIAGNOSIS — H6503 Acute serous otitis media, bilateral: Secondary | ICD-10-CM | POA: Diagnosis not present

## 2016-09-02 DIAGNOSIS — Z5189 Encounter for other specified aftercare: Secondary | ICD-10-CM | POA: Diagnosis not present

## 2016-09-04 DIAGNOSIS — Z760 Encounter for issue of repeat prescription: Secondary | ICD-10-CM | POA: Diagnosis not present

## 2016-09-09 DIAGNOSIS — Z5189 Encounter for other specified aftercare: Secondary | ICD-10-CM | POA: Diagnosis not present

## 2016-09-11 ENCOUNTER — Encounter: Payer: Self-pay | Admitting: Family Medicine

## 2016-09-11 ENCOUNTER — Ambulatory Visit (INDEPENDENT_AMBULATORY_CARE_PROVIDER_SITE_OTHER): Payer: BLUE CROSS/BLUE SHIELD | Admitting: Family Medicine

## 2016-09-11 VITALS — BP 118/89 | HR 72 | Temp 98.1°F | Ht 63.0 in | Wt 105.0 lb

## 2016-09-11 DIAGNOSIS — K5732 Diverticulitis of large intestine without perforation or abscess without bleeding: Secondary | ICD-10-CM

## 2016-09-11 MED ORDER — CEFTRIAXONE SODIUM 1 G IJ SOLR
1.0000 g | Freq: Once | INTRAMUSCULAR | Status: AC
  Administered 2016-09-11: 1 g via INTRAMUSCULAR

## 2016-09-11 MED ORDER — METRONIDAZOLE 500 MG PO TABS: 500.0000 mg | ORAL_TABLET | Freq: Three times a day (TID) | ORAL | 0 refills | Status: DC

## 2016-09-11 MED ORDER — CIPROFLOXACIN HCL 500 MG PO TABS: 500.0000 mg | ORAL_TABLET | Freq: Two times a day (BID) | ORAL | 0 refills | Status: DC

## 2016-09-11 NOTE — Addendum Note (Signed)
Addended by: Aniceto BossNIMMONS, SYLVIA A on: 09/11/2016 05:08 PM   Modules accepted: Orders

## 2016-09-11 NOTE — Progress Notes (Signed)
   Subjective:    Patient ID: Sierra HarmanDeborah Peters, female    DOB: May 11, 1961, 55 y.o. y.o.   MRN: [id number]  HPI Here for 3 days of lower abdominal crampy pains, bloating, nausea without vomiting, and diarrhea. At times she has seen some bright red blood in the stool. At times there has been dark stools. No fever. She is drinking fluids and eating a bland diet. No recent travel or antibiotic use.    Review of Systems  Constitutional: Negative.   Respiratory: Negative.   Cardiovascular: Negative.   Gastrointestinal: Positive for abdominal distention, abdominal pain, blood in stool, diarrhea and nausea. Negative for anal bleeding, constipation, rectal pain and vomiting.  Genitourinary: Negative.   Neurological: Negative.        Objective:   Physical Exam  Constitutional: She appears well-developed and well-nourished. No distress.  Cardiovascular: Normal rate, regular rhythm, normal heart sounds and intact distal pulses.   Pulmonary/Chest: Effort normal and breath sounds normal.  Abdominal: Soft. Bowel sounds are normal. She exhibits no distension and no mass. There is no rebound and no guarding.  Mild tenderness in the left flank and LLQ           Assessment & Plan:  Diverticulitis. Given a shot of Rocephin. Start on Flagyl and Cipro for 10 days. Use Tylenol as needed for pain. Recheck next week. If the pain gets worse or if she gets a fever, she will go to the ER.  Gershon CraneStephen Elner Seifert, MD

## 2016-09-22 ENCOUNTER — Telehealth: Payer: Self-pay | Admitting: Family Medicine

## 2016-09-22 NOTE — Telephone Encounter (Signed)
Pt states the  ciprofloxacin (CIPRO) 500 MG tablet  is causing her to have severe muscle and joint pain in her arms. Rx was prescribed 09/11/2016.  Pt states she stopped taking yesterday. Pt had 3 tabs left. Wanted to make Dr Clent RidgesFry aware.   Pt states her diverticulitis is better, but the muscle pain is severe.  [place] [facility]

## 2016-09-22 NOTE — Telephone Encounter (Signed)
I'm glad the diverticulitis is better. Cipro can cause joint pains but this is reversible. They should fade away over the next few days. Take Ibuprofen as needed and drink lots of water. I will add Cipro to her allergy list.

## 2016-09-22 NOTE — Telephone Encounter (Signed)
I spoke with pt and went over below information. 

## 2016-09-23 DIAGNOSIS — J309 Allergic rhinitis, unspecified: Secondary | ICD-10-CM | POA: Diagnosis not present

## 2016-09-30 DIAGNOSIS — Z5189 Encounter for other specified aftercare: Secondary | ICD-10-CM | POA: Diagnosis not present

## 2016-09-30 DIAGNOSIS — N959 Unspecified menopausal and perimenopausal disorder: Secondary | ICD-10-CM | POA: Diagnosis not present

## 2016-10-09 DIAGNOSIS — Z516 Encounter for desensitization to allergens: Secondary | ICD-10-CM | POA: Diagnosis not present

## 2016-10-12 DIAGNOSIS — F329 Major depressive disorder, single episode, unspecified: Secondary | ICD-10-CM | POA: Diagnosis not present

## 2016-10-12 DIAGNOSIS — R140 Abdominal distension (gaseous): Secondary | ICD-10-CM | POA: Diagnosis not present

## 2016-10-12 DIAGNOSIS — N959 Unspecified menopausal and perimenopausal disorder: Secondary | ICD-10-CM | POA: Diagnosis not present

## 2016-10-12 DIAGNOSIS — K219 Gastro-esophageal reflux disease without esophagitis: Secondary | ICD-10-CM | POA: Diagnosis not present

## 2016-10-14 DIAGNOSIS — Z5189 Encounter for other specified aftercare: Secondary | ICD-10-CM | POA: Diagnosis not present

## 2016-10-21 ENCOUNTER — Encounter: Payer: Self-pay | Admitting: Family Medicine

## 2016-10-21 ENCOUNTER — Ambulatory Visit (INDEPENDENT_AMBULATORY_CARE_PROVIDER_SITE_OTHER): Payer: BLUE CROSS/BLUE SHIELD | Admitting: Family Medicine

## 2016-10-21 VITALS — BP 113/86 | HR 80 | Temp 98.4°F | Ht 63.0 in | Wt 105.0 lb

## 2016-10-21 DIAGNOSIS — M1990 Unspecified osteoarthritis, unspecified site: Secondary | ICD-10-CM | POA: Diagnosis not present

## 2016-10-21 DIAGNOSIS — Z0000 Encounter for general adult medical examination without abnormal findings: Secondary | ICD-10-CM | POA: Diagnosis not present

## 2016-10-21 DIAGNOSIS — Z5189 Encounter for other specified aftercare: Secondary | ICD-10-CM | POA: Diagnosis not present

## 2016-10-21 MED ORDER — METHYLPREDNISOLONE 4 MG PO TBPK: ORAL_TABLET | ORAL | 0 refills | Status: DC

## 2016-10-21 NOTE — Progress Notes (Signed)
   Subjective:    Patient ID: Sierra HarmanDeborah Farney, female    DOB: 08-06-1961, 56 y.o.   MRN: [id number]  HPI Here for one month of worsening joint pains. These are widespread, involving the shoulders, elbow, wrists, hands, hips, and knees. No swelling. No fevers. She is on Meloxicam daily and she has added Ibuprofen to this a few times. This helps for a few hours, but the pain has become more of a problem. She has trouble sleeping at night. She notes a strong family hx of arthritis.    Review of Systems  Constitutional: Negative.   Respiratory: Negative.   Cardiovascular: Negative.   Gastrointestinal: Negative.   Musculoskeletal: Positive for arthralgias. Negative for joint swelling.  Skin: Negative for rash.  Neurological: Negative.        Objective:   Physical Exam  Constitutional: She appears well-developed and well-nourished.  Cardiovascular: Normal rate, regular rhythm, normal heart sounds and intact distal pulses.   Pulmonary/Chest: Effort normal and breath sounds normal.  Musculoskeletal:  She has mildly swollen DIP and PIP joints on both hands, and these are mildly tender. Both wrists are tender.   Skin: No rash noted.          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 We will give her a Medrol dose pack to get the inflammation down. Get labs today. Refer to Rheumatology.  Gershon CraneStephen Fry, MD

## 2016-10-21 NOTE — Progress Notes (Signed)
Pre visit review using our clinic review tool, if applicable. No additional management support is needed unless otherwise documented below in the visit note. 

## 2016-10-22 DIAGNOSIS — J3089 Other allergic rhinitis: Secondary | ICD-10-CM | POA: Diagnosis not present

## 2016-10-22 LAB — CBC WITH DIFFERENTIAL/PLATELET
BASOS PCT: 0.7 % (ref 0.0–3.0)
Basophils Absolute: 0 10*3/uL (ref 0.0–0.1)
Eosinophils Absolute: 0.1 10*3/uL (ref 0.0–0.7)
Eosinophils Relative: 1.4 % (ref 0.0–5.0)
HEMATOCRIT: 39.8 % (ref 36.0–46.0)
Hemoglobin: 13.3 g/dL (ref 12.0–15.0)
Lymphocytes Relative: 23.3 % (ref 12.0–46.0)
Lymphs Abs: 1.2 10*3/uL (ref 0.7–4.0)
MCHC: 33.5 g/dL (ref 30.0–36.0)
MCV: 99.8 fl (ref 78.0–100.0)
MONO ABS: 0.4 10*3/uL (ref 0.1–1.0)
Monocytes Relative: 8.7 % (ref 3.0–12.0)
NEUTROS ABS: 3.4 10*3/uL (ref 1.4–7.7)
Neutrophils Relative %: 65.9 % (ref 43.0–77.0)
PLATELETS: 295 10*3/uL (ref 150.0–400.0)
RBC: 3.99 Mil/uL (ref 3.87–5.11)
RDW: 14.3 % (ref 11.5–15.5)
WBC: 5.1 10*3/uL (ref 4.0–10.5)

## 2016-10-22 LAB — RHEUMATOID FACTOR: Rhuematoid fact SerPl-aCnc: <14 IU/mL (ref <14)

## 2016-10-22 LAB — SEDIMENTATION RATE: SED RATE: 1 mm/h (ref 0–30)

## 2016-10-22 LAB — ANA: Anti Nuclear Antibody(ANA): NEGATIVE

## 2016-10-22 LAB — C-REACTIVE PROTEIN: CRP: 0.1 mg/dL — ABNORMAL LOW (ref 0.5–20.0)

## 2016-10-28 DIAGNOSIS — Z5189 Encounter for other specified aftercare: Secondary | ICD-10-CM | POA: Diagnosis not present

## 2016-11-04 DIAGNOSIS — Z5189 Encounter for other specified aftercare: Secondary | ICD-10-CM | POA: Diagnosis not present

## 2016-11-06 ENCOUNTER — Other Ambulatory Visit: Payer: Self-pay | Admitting: Family Medicine

## 2016-11-06 NOTE — Telephone Encounter (Signed)
[facility]

## 2016-11-06 NOTE — Telephone Encounter (Signed)
Call in #60 with 5 rf 

## 2016-11-11 DIAGNOSIS — Z5189 Encounter for other specified aftercare: Secondary | ICD-10-CM | POA: Diagnosis not present

## 2016-11-20 DIAGNOSIS — Z5189 Encounter for other specified aftercare: Secondary | ICD-10-CM | POA: Diagnosis not present

## 2016-11-25 DIAGNOSIS — Z5189 Encounter for other specified aftercare: Secondary | ICD-10-CM | POA: Diagnosis not present

## 2016-12-03 DIAGNOSIS — Z5189 Encounter for other specified aftercare: Secondary | ICD-10-CM | POA: Diagnosis not present

## 2016-12-03 DIAGNOSIS — F329 Major depressive disorder, single episode, unspecified: Secondary | ICD-10-CM | POA: Diagnosis not present

## 2016-12-03 DIAGNOSIS — M19041 Primary osteoarthritis, right hand: Secondary | ICD-10-CM | POA: Diagnosis not present

## 2016-12-03 DIAGNOSIS — Z760 Encounter for issue of repeat prescription: Secondary | ICD-10-CM | POA: Diagnosis not present

## 2016-12-03 DIAGNOSIS — K219 Gastro-esophageal reflux disease without esophagitis: Secondary | ICD-10-CM | POA: Diagnosis not present

## 2016-12-09 DIAGNOSIS — Z5189 Encounter for other specified aftercare: Secondary | ICD-10-CM | POA: Diagnosis not present

## 2016-12-16 DIAGNOSIS — Z5189 Encounter for other specified aftercare: Secondary | ICD-10-CM | POA: Diagnosis not present

## 2016-12-23 DIAGNOSIS — Z5189 Encounter for other specified aftercare: Secondary | ICD-10-CM | POA: Diagnosis not present

## 2016-12-30 DIAGNOSIS — J309 Allergic rhinitis, unspecified: Secondary | ICD-10-CM | POA: Diagnosis not present

## 2017-01-04 DIAGNOSIS — J069 Acute upper respiratory infection, unspecified: Secondary | ICD-10-CM | POA: Diagnosis not present

## 2017-01-06 DIAGNOSIS — Z5189 Encounter for other specified aftercare: Secondary | ICD-10-CM | POA: Diagnosis not present

## 2017-01-13 DIAGNOSIS — Z5189 Encounter for other specified aftercare: Secondary | ICD-10-CM | POA: Diagnosis not present

## 2017-01-18 DIAGNOSIS — J309 Allergic rhinitis, unspecified: Secondary | ICD-10-CM | POA: Diagnosis not present

## 2017-01-20 DIAGNOSIS — Z5189 Encounter for other specified aftercare: Secondary | ICD-10-CM | POA: Diagnosis not present

## 2017-01-26 DIAGNOSIS — F329 Major depressive disorder, single episode, unspecified: Secondary | ICD-10-CM | POA: Diagnosis not present

## 2017-01-26 DIAGNOSIS — N959 Unspecified menopausal and perimenopausal disorder: Secondary | ICD-10-CM | POA: Diagnosis not present

## 2017-01-26 DIAGNOSIS — K219 Gastro-esophageal reflux disease without esophagitis: Secondary | ICD-10-CM | POA: Diagnosis not present

## 2017-01-26 DIAGNOSIS — R140 Abdominal distension (gaseous): Secondary | ICD-10-CM | POA: Diagnosis not present

## 2017-01-27 DIAGNOSIS — Z5189 Encounter for other specified aftercare: Secondary | ICD-10-CM | POA: Diagnosis not present

## 2017-02-02 ENCOUNTER — Other Ambulatory Visit: Payer: Self-pay

## 2017-02-02 MED ORDER — ALPRAZOLAM 0.5 MG PO TABS: ORAL_TABLET | ORAL | 5 refills | Status: DC

## 2017-02-02 NOTE — Telephone Encounter (Signed)
Call in #90 with 5 rf 

## 2017-02-02 NOTE — Telephone Encounter (Signed)
Patient has requested a refill on Alprazolam 0.5mg . Last refilled 07/21/16 #90 with 5 refills. Ok to refill?

## 2017-02-03 DIAGNOSIS — Z5189 Encounter for other specified aftercare: Secondary | ICD-10-CM | POA: Diagnosis not present

## 2017-02-10 DIAGNOSIS — Z5189 Encounter for other specified aftercare: Secondary | ICD-10-CM | POA: Diagnosis not present

## 2017-02-16 DIAGNOSIS — L570 Actinic keratosis: Secondary | ICD-10-CM | POA: Diagnosis not present

## 2017-02-16 DIAGNOSIS — L820 Inflamed seborrheic keratosis: Secondary | ICD-10-CM | POA: Diagnosis not present

## 2017-02-16 DIAGNOSIS — L718 Other rosacea: Secondary | ICD-10-CM | POA: Diagnosis not present

## 2017-02-16 DIAGNOSIS — L821 Other seborrheic keratosis: Secondary | ICD-10-CM | POA: Diagnosis not present

## 2017-02-16 DIAGNOSIS — Z85828 Personal history of other malignant neoplasm of skin: Secondary | ICD-10-CM | POA: Diagnosis not present

## 2017-02-17 DIAGNOSIS — Z5189 Encounter for other specified aftercare: Secondary | ICD-10-CM | POA: Diagnosis not present

## 2017-02-24 DIAGNOSIS — F329 Major depressive disorder, single episode, unspecified: Secondary | ICD-10-CM | POA: Diagnosis not present

## 2017-02-24 DIAGNOSIS — N959 Unspecified menopausal and perimenopausal disorder: Secondary | ICD-10-CM | POA: Diagnosis not present

## 2017-02-24 DIAGNOSIS — R140 Abdominal distension (gaseous): Secondary | ICD-10-CM | POA: Diagnosis not present

## 2017-02-24 DIAGNOSIS — Z5189 Encounter for other specified aftercare: Secondary | ICD-10-CM | POA: Diagnosis not present

## 2017-02-24 DIAGNOSIS — K219 Gastro-esophageal reflux disease without esophagitis: Secondary | ICD-10-CM | POA: Diagnosis not present

## 2017-03-03 DIAGNOSIS — Z5189 Encounter for other specified aftercare: Secondary | ICD-10-CM | POA: Diagnosis not present

## 2017-03-09 DIAGNOSIS — J3089 Other allergic rhinitis: Secondary | ICD-10-CM | POA: Diagnosis not present

## 2017-03-09 DIAGNOSIS — R0602 Shortness of breath: Secondary | ICD-10-CM | POA: Diagnosis not present

## 2017-03-09 DIAGNOSIS — R499 Unspecified voice and resonance disorder: Secondary | ICD-10-CM | POA: Diagnosis not present

## 2017-03-09 DIAGNOSIS — H1045 Other chronic allergic conjunctivitis: Secondary | ICD-10-CM | POA: Diagnosis not present

## 2017-03-10 DIAGNOSIS — Z5189 Encounter for other specified aftercare: Secondary | ICD-10-CM | POA: Diagnosis not present

## 2017-03-17 DIAGNOSIS — F329 Major depressive disorder, single episode, unspecified: Secondary | ICD-10-CM | POA: Diagnosis not present

## 2017-03-17 DIAGNOSIS — M19041 Primary osteoarthritis, right hand: Secondary | ICD-10-CM | POA: Diagnosis not present

## 2017-03-17 DIAGNOSIS — Z760 Encounter for issue of repeat prescription: Secondary | ICD-10-CM | POA: Diagnosis not present

## 2017-03-17 DIAGNOSIS — K219 Gastro-esophageal reflux disease without esophagitis: Secondary | ICD-10-CM | POA: Diagnosis not present

## 2017-03-17 DIAGNOSIS — J3089 Other allergic rhinitis: Secondary | ICD-10-CM | POA: Diagnosis not present

## 2017-03-31 DIAGNOSIS — Z5189 Encounter for other specified aftercare: Secondary | ICD-10-CM | POA: Diagnosis not present

## 2017-03-31 DIAGNOSIS — L309 Dermatitis, unspecified: Secondary | ICD-10-CM | POA: Diagnosis not present

## 2017-04-01 DIAGNOSIS — J3089 Other allergic rhinitis: Secondary | ICD-10-CM | POA: Diagnosis not present

## 2017-04-07 DIAGNOSIS — Z5189 Encounter for other specified aftercare: Secondary | ICD-10-CM | POA: Diagnosis not present

## 2017-04-13 ENCOUNTER — Other Ambulatory Visit: Payer: Self-pay | Admitting: Family Medicine

## 2017-04-14 DIAGNOSIS — Z5189 Encounter for other specified aftercare: Secondary | ICD-10-CM | POA: Diagnosis not present

## 2017-04-20 DIAGNOSIS — R490 Dysphonia: Secondary | ICD-10-CM | POA: Diagnosis not present

## 2017-04-21 DIAGNOSIS — Z5189 Encounter for other specified aftercare: Secondary | ICD-10-CM | POA: Diagnosis not present

## 2017-04-26 DIAGNOSIS — L570 Actinic keratosis: Secondary | ICD-10-CM | POA: Diagnosis not present

## 2017-04-26 DIAGNOSIS — C44529 Squamous cell carcinoma of skin of other part of trunk: Secondary | ICD-10-CM | POA: Diagnosis not present

## 2017-04-28 DIAGNOSIS — Z5189 Encounter for other specified aftercare: Secondary | ICD-10-CM | POA: Diagnosis not present

## 2017-05-04 ENCOUNTER — Other Ambulatory Visit: Payer: Self-pay | Admitting: Family Medicine

## 2017-05-04 NOTE — Telephone Encounter (Signed)
Call in #30 with 5 rf 

## 2017-05-05 DIAGNOSIS — Z5189 Encounter for other specified aftercare: Secondary | ICD-10-CM | POA: Diagnosis not present

## 2017-05-12 DIAGNOSIS — Z0000 Encounter for general adult medical examination without abnormal findings: Secondary | ICD-10-CM | POA: Diagnosis not present

## 2017-05-12 DIAGNOSIS — Z5189 Encounter for other specified aftercare: Secondary | ICD-10-CM | POA: Diagnosis not present

## 2017-05-19 DIAGNOSIS — Z5189 Encounter for other specified aftercare: Secondary | ICD-10-CM | POA: Diagnosis not present

## 2017-05-26 DIAGNOSIS — Z5189 Encounter for other specified aftercare: Secondary | ICD-10-CM | POA: Diagnosis not present

## 2017-06-02 DIAGNOSIS — Z5189 Encounter for other specified aftercare: Secondary | ICD-10-CM | POA: Diagnosis not present

## 2017-06-09 DIAGNOSIS — Z5189 Encounter for other specified aftercare: Secondary | ICD-10-CM | POA: Diagnosis not present

## 2017-06-10 ENCOUNTER — Encounter: Payer: Self-pay | Admitting: Family Medicine

## 2017-06-16 DIAGNOSIS — Z5189 Encounter for other specified aftercare: Secondary | ICD-10-CM | POA: Diagnosis not present

## 2017-06-17 DIAGNOSIS — F329 Major depressive disorder, single episode, unspecified: Secondary | ICD-10-CM | POA: Diagnosis not present

## 2017-06-17 DIAGNOSIS — J3089 Other allergic rhinitis: Secondary | ICD-10-CM | POA: Diagnosis not present

## 2017-06-17 DIAGNOSIS — M19041 Primary osteoarthritis, right hand: Secondary | ICD-10-CM | POA: Diagnosis not present

## 2017-06-17 DIAGNOSIS — K219 Gastro-esophageal reflux disease without esophagitis: Secondary | ICD-10-CM | POA: Diagnosis not present

## 2017-06-23 DIAGNOSIS — Z5189 Encounter for other specified aftercare: Secondary | ICD-10-CM | POA: Diagnosis not present

## 2017-06-30 DIAGNOSIS — Z23 Encounter for immunization: Secondary | ICD-10-CM | POA: Diagnosis not present

## 2017-06-30 DIAGNOSIS — Z5189 Encounter for other specified aftercare: Secondary | ICD-10-CM | POA: Diagnosis not present

## 2017-07-07 DIAGNOSIS — Z5189 Encounter for other specified aftercare: Secondary | ICD-10-CM | POA: Diagnosis not present

## 2017-07-08 DIAGNOSIS — Z5189 Encounter for other specified aftercare: Secondary | ICD-10-CM | POA: Diagnosis not present

## 2017-07-13 DIAGNOSIS — N951 Menopausal and female climacteric states: Secondary | ICD-10-CM | POA: Diagnosis not present

## 2017-07-13 DIAGNOSIS — Z1151 Encounter for screening for human papillomavirus (HPV): Secondary | ICD-10-CM | POA: Diagnosis not present

## 2017-07-13 DIAGNOSIS — Z681 Body mass index (BMI) 19 or less, adult: Secondary | ICD-10-CM | POA: Diagnosis not present

## 2017-07-13 DIAGNOSIS — Z1231 Encounter for screening mammogram for malignant neoplasm of breast: Secondary | ICD-10-CM | POA: Diagnosis not present

## 2017-07-13 DIAGNOSIS — R0683 Snoring: Secondary | ICD-10-CM | POA: Diagnosis not present

## 2017-07-13 DIAGNOSIS — Z01419 Encounter for gynecological examination (general) (routine) without abnormal findings: Secondary | ICD-10-CM | POA: Diagnosis not present

## 2017-07-13 DIAGNOSIS — N971 Female infertility of tubal origin: Secondary | ICD-10-CM | POA: Diagnosis not present

## 2017-07-13 DIAGNOSIS — N888 Other specified noninflammatory disorders of cervix uteri: Secondary | ICD-10-CM | POA: Diagnosis not present

## 2017-07-14 DIAGNOSIS — Z5189 Encounter for other specified aftercare: Secondary | ICD-10-CM | POA: Diagnosis not present

## 2017-07-21 DIAGNOSIS — Z5189 Encounter for other specified aftercare: Secondary | ICD-10-CM | POA: Diagnosis not present

## 2017-07-28 DIAGNOSIS — Z5189 Encounter for other specified aftercare: Secondary | ICD-10-CM | POA: Diagnosis not present

## 2017-08-04 DIAGNOSIS — Z5189 Encounter for other specified aftercare: Secondary | ICD-10-CM | POA: Diagnosis not present

## 2017-08-11 DIAGNOSIS — Z5189 Encounter for other specified aftercare: Secondary | ICD-10-CM | POA: Diagnosis not present

## 2017-08-18 DIAGNOSIS — Z5189 Encounter for other specified aftercare: Secondary | ICD-10-CM | POA: Diagnosis not present

## 2017-08-25 ENCOUNTER — Encounter: Payer: Self-pay | Admitting: Neurology

## 2017-08-25 ENCOUNTER — Ambulatory Visit: Payer: BLUE CROSS/BLUE SHIELD | Admitting: Neurology

## 2017-08-25 VITALS — BP 131/93 | HR 96 | Ht 63.0 in | Wt 106.0 lb

## 2017-08-25 DIAGNOSIS — R51 Headache: Secondary | ICD-10-CM

## 2017-08-25 DIAGNOSIS — R0683 Snoring: Secondary | ICD-10-CM | POA: Diagnosis not present

## 2017-08-25 DIAGNOSIS — G4719 Other hypersomnia: Secondary | ICD-10-CM

## 2017-08-25 DIAGNOSIS — R0681 Apnea, not elsewhere classified: Secondary | ICD-10-CM | POA: Diagnosis not present

## 2017-08-25 DIAGNOSIS — R519 Headache, unspecified: Secondary | ICD-10-CM

## 2017-08-25 DIAGNOSIS — R351 Nocturia: Secondary | ICD-10-CM | POA: Diagnosis not present

## 2017-08-25 DIAGNOSIS — Z5189 Encounter for other specified aftercare: Secondary | ICD-10-CM | POA: Diagnosis not present

## 2017-08-25 NOTE — Patient Instructions (Signed)

## 2017-08-25 NOTE — Progress Notes (Signed)
Subjective:    Patient ID: Sierra Peters is a 56 y.o. female.  HPI     Huston Foley, MD, PhD [facility] [address] [address] [place], [place] [address]  Dear Dr. Ernestina Penna,   I saw your patient, Sierra Peters, upon your kind request in my neurologic clinic today for initial consultation of her sleep disorder, in particular, concern for underlying obstructive sleep apnea. The patient is unaccompanied today. As you know, Ms. [name] is a 56 year old right-handed woman with an underlying medical history of arthralgia, allergies, anxiety, depression, recurrent headaches, and mitral valve prolapse, who reports snoring and excessive daytime somnolence. I reviewed your office note from 07/13/2017, which you kindly included. Her Epworth sleepiness score is 12 out of 24 today, fatigue score is 15 out of 63. She is separated, she lives alone. She has 2 children. She works for [employer]. She has had sleep onset and sleep maintenance difficulties for years. She has a history of anxiety, takes Xanax as needed, typically 1 pill at night but not nightly. She does take Ambien generic 10 mg each night and has done so for the past 2 years or so. She tries to keep a scheduled for her bedtime and wake up time. She does not typically watch TV in bed. Bedtime is around 10 PM and wakeup time generally around 6 AM. She does not typically nap. She takes Lamictal 100 mg at night, denies restless leg symptoms, has nocturia at least once per average night. She has had the occasional morning headache, attributes this to sinus issues. She suffers from recurrent allergy symptoms. She has been witnessed to have apneic breathing pauses while asleep. She has shared a room with friends on recent vacation trips and has been told by multiple friends that she has pauses and loud snoring and snorting sounds. She has no family history of OSA. She does not smoke and drinks alcohol occasionally, caffeine as  well.  Her Past Medical History Is Significant For: Past Medical History:  Diagnosis Date  . Allergy    sees Dr. Corinda Gubler  . Anxiety   . Chronic fatigue   . Depression   . Headache(784.0)   . MVP (mitral valve prolapse)     Her Past Surgical History Is Significant For: Past Surgical History:  Procedure Laterality Date  . APPENDECTOMY    . BREAST SURGERY     augmentation per Dr. Stephens November  . COLONOSCOPY  02/07/2013   per Dr. Ewing Schlein, sigmoid diverticula, no polyps, repeat in 10 yrs   . ESOPHAGOGASTRODUODENOSCOPY  02/07/2013   per Dr. Ewing Schlein, esophageal erosions, hiatal hernia   . TUBAL LIGATION      Her Family History Is Significant For: Family History  Problem Relation Age of Onset  . Arthritis Unknown   . Heart disease Unknown        cardiovascular disorder    Her Social History Is Significant For: Social History   Socioeconomic History  . Marital status: Married    Spouse name: None  . Number of children: None  . Years of education: None  . Highest education level: None  Social Needs  . Financial resource strain: None  . Food insecurity - worry: None  . Food insecurity - inability: None  . Transportation needs - medical: None  . Transportation needs - non-medical: None  Occupational History  . None  Tobacco Use  . Smoking status: Never Smoker  . Smokeless tobacco: Never Used  Substance and Sexual Activity  .  Alcohol use: Yes    Alcohol/week: 1.2 oz    Types: 2 Standard drinks or equivalent per week  . Drug use: No  . Sexual activity: None  Other Topics Concern  . None  Social History Narrative  . None    Her Allergies Are:  Allergies  Allergen Reactions  . Ciprofloxacin Other (See Comments)    Joint pains   . Erythromycin   :   Her Current Medications Are:  Outpatient Encounter Medications as of 08/25/2017  Medication Sig  . ALPRAZolam (XANAX) 0.5 MG tablet TAKE 1 TABLET 3 TIMES A DAY AS NEEDED.  Marland Kitchen. desloratadine-pseudoephedrine (CLARINEX-D  24-HOUR) 5-240 MG per 24 hr tablet Take 1 tablet by mouth daily.   Marland Kitchen. Dexlansoprazole (DEXILANT PO) Take by mouth daily.  Marland Kitchen. escitalopram (LEXAPRO) 20 MG tablet Take 1 tablet (20 mg total) by mouth daily.  Marland Kitchen. estradiol (VIVELLE-DOT) 0.05 MG/24HR patch Place 1 patch onto the skin 2 (two) times a week.  . lamoTRIgine (LAMICTAL) 100 MG tablet Take 1 tablet (100 mg total) by mouth daily.  . meloxicam (MOBIC) 15 MG tablet TAKE 1 TABLET DAILY  . methylPREDNISolone (MEDROL DOSEPAK) 4 MG TBPK tablet As directed  . minocycline (DYNACIN) 100 MG tablet Take 100 mg by mouth once.    . montelukast (SINGULAIR) 10 MG tablet Take 10 mg by mouth at bedtime.  . NON FORMULARY Allergy shot 1 x week  . progesterone (PROMETRIUM) 100 MG capsule Take 100 mg by mouth daily.  Marland Kitchen. zolpidem (AMBIEN) 10 MG tablet TAKE 1 TABLET AT BEDTIME AS NEEDED.   No facility-administered encounter medications on file as of 08/25/2017.   :  Review of Systems:  Out of a complete 14 point review of systems, all are reviewed and negative with the exception of these symptoms as listed below:  Review of Systems  Neurological:       Pt presents today to discuss her sleep. Pt has never had a sleep study but does endorse snoring.  Epworth Sleepiness Scale 0= would never doze 1= slight chance of dozing 2= moderate chance of dozing 3= high chance of dozing  Sitting and reading: 2 Watching TV: 2 Sitting inactive in a public place (ex. Theater or meeting): 2 As a passenger in a car for an hour without a break: 2 Lying down to rest in the afternoon: 1 Sitting and talking to someone: 0 Sitting quietly after lunch (no alcohol): 2 In a car, while stopped in traffic: 1 Total: 12     Objective:  Neurological Exam  Physical Exam Physical Examination:   Vitals:   08/25/17 1618  BP: (!) 131/93  Pulse: 96    General Examination: The patient is a very pleasant 56 y.o. female in no acute distress. She appears well-developed and  well-nourished and well groomed. Very slender.  HEENT: Normocephalic, atraumatic, pupils are equal, round and reactive to light and accommodation. Funduscopic exam is normal with sharp disc margins noted. Bilateral contact lenses in place. Extraocular tracking is good without limitation to gaze excursion or nystagmus noted. Normal smooth pursuit is noted. Hearing is grossly intact. Face is symmetric with normal facial animation and normal facial sensation. Speech is clear with no dysarthria noted. There is no hypophonia. There is no lip, neck/head, jaw or voice tremor. Neck is supple with full range of passive and active motion. There are no carotid bruits on auscultation. Oropharynx exam reveals: mild mouth dryness, good dental hygiene and mild airway crowding, due to small airway entry  and floppy appearing soft palate, uvula is rather small, tonsils are small. Tongue is normal in size. Mallampati is class I. Neck circumference is 12-5/8 inches. She has a mild overbite. Nasal anatomy is also narrow but otherwise unremarkable.   Chest: Clear to auscultation without wheezing, rhonchi or crackles noted.  Heart: S1+S2+0, regular and normal without murmurs, rubs or gallops noted.   Abdomen: Soft, non-tender and non-distended with normal bowel sounds appreciated on auscultation.  Extremities: There is no pitting edema in the distal lower extremities bilaterally. Pedal pulses are intact.  Skin: Warm and dry without trophic changes noted.  Musculoskeletal: exam reveals no obvious joint deformities, tenderness or joint swelling or erythema.   Neurologically:  Mental status: The patient is awake, alert and oriented in all 4 spheres. Her immediate and remote memory, attention, language skills and fund of knowledge are appropriate. There is no evidence of aphasia, agnosia, apraxia or anomia. Speech is clear with normal prosody and enunciation. Thought process is linear. Mood is normal and affect is normal.   Cranial nerves II - XII are as described above under HEENT exam. In addition: shoulder shrug is normal with equal shoulder height noted. Motor exam: Normal bulk, strength and tone is noted. There is no drift, tremor or rebound. Romberg is negative. Reflexes are 2+ throughout. Fine motor skills and coordination: intact with normal finger taps, normal hand movements, normal rapid alternating patting, normal foot taps and normal foot agility.  Cerebellar testing: No dysmetria or intention tremor on finger to nose testing. Heel to shin is unremarkable bilaterally. There is no truncal or gait ataxia.  Sensory exam: intact to light touch in the upper and lower extremities.  Gait, station and balance: She stands easily. No veering to one side is noted. No leaning to one side is noted. Posture is age-appropriate and stance is narrow based. Gait shows normal stride length and normal pace. No problems turning are noted. Tandem walk is unremarkable.   Assessment and Plan:  In summary, Maeola HarmanDeborah Bon is a very pleasant 56 y.o.-year old female with an underlying medical history of arthralgia, allergies, anxiety, depression, recurrent headaches, and mitral valve prolapse, whose history and physical exam are concerning for obstructive sleep apnea (OSA). I had a long chat with the patient about my findings and the diagnosis of OSA, its prognosis and treatment options. We talked about medical treatments, surgical interventions and non-pharmacological approaches. I explained in particular the risks and ramifications of untreated moderate to severe OSA, especially with respect to developing cardiovascular disease down the Road, including congestive heart failure, difficult to treat hypertension, cardiac arrhythmias, or stroke. Even type 2 diabetes has, in part, been linked to untreated OSA. Symptoms of untreated OSA include daytime sleepiness, memory problems, mood irritability and mood disorder such as depression and  anxiety, lack of energy, as well as recurrent headaches, especially morning headaches. We talked about trying to maintain a healthy lifestyle in general, as well as the importance of weight control. I encouraged the patient to eat healthy, exercise daily and keep well hydrated, to keep a scheduled bedtime and wake time routine, to not skip any meals and eat healthy snacks in between meals. I advised the patient not to drive when feeling sleepy. I recommended the following at this time: sleep study with potential positive airway pressure titration. (We will score hypopneas at 3%).   I explained the sleep test procedure to the patient and also outlined possible surgical and non-surgical treatment options of OSA, including the use  of a custom-made dental device (which would require a referral to a specialist dentist or oral surgeon), upper airway surgical options, such as pillar implants, radiofrequency surgery, tongue base surgery, and UPPP (which would involve a referral to an ENT surgeon). Rarely, jaw surgery such as mandibular advancement may be considered.  I also explained the CPAP treatment option to the patient, who indicated that she would be willing to try CPAP if the need arises. I explained the importance of being compliant with PAP treatment, not only for insurance purposes but primarily to improve Her symptoms, and for the patient's long term health benefit, including to reduce Her cardiovascular risks. I answered all her questions today and the patient was in agreement. I would like to see her back after the sleep study is completed and encouraged her to call with any interim questions, concerns, problems or updates.   Thank you very much for allowing me to participate in the care of this nice patient. If I can be of any further assistance to you please do not hesitate to call me at [phone].  Sincerely,   Star Age, MD, PhD

## 2017-08-27 DIAGNOSIS — J3089 Other allergic rhinitis: Secondary | ICD-10-CM | POA: Diagnosis not present

## 2017-09-01 DIAGNOSIS — Z5189 Encounter for other specified aftercare: Secondary | ICD-10-CM | POA: Diagnosis not present

## 2017-09-06 ENCOUNTER — Other Ambulatory Visit: Payer: Self-pay | Admitting: Family Medicine

## 2017-09-06 NOTE — Telephone Encounter (Signed)
Sent to PCP for approval.  

## 2017-09-07 NOTE — Telephone Encounter (Signed)
Call in #90 with 5 rf 

## 2017-09-08 DIAGNOSIS — Z5189 Encounter for other specified aftercare: Secondary | ICD-10-CM | POA: Diagnosis not present

## 2017-09-08 NOTE — Telephone Encounter (Signed)
Rx was called in to pharmacy. 

## 2017-09-21 HISTORY — PX: AUGMENTATION MAMMAPLASTY: SUR837

## 2017-09-22 DIAGNOSIS — Z5189 Encounter for other specified aftercare: Secondary | ICD-10-CM | POA: Diagnosis not present

## 2017-09-27 DIAGNOSIS — F329 Major depressive disorder, single episode, unspecified: Secondary | ICD-10-CM | POA: Diagnosis not present

## 2017-09-27 DIAGNOSIS — K219 Gastro-esophageal reflux disease without esophagitis: Secondary | ICD-10-CM | POA: Diagnosis not present

## 2017-09-27 DIAGNOSIS — J3089 Other allergic rhinitis: Secondary | ICD-10-CM | POA: Diagnosis not present

## 2017-09-27 DIAGNOSIS — M19041 Primary osteoarthritis, right hand: Secondary | ICD-10-CM | POA: Diagnosis not present

## 2017-09-29 DIAGNOSIS — Z5189 Encounter for other specified aftercare: Secondary | ICD-10-CM | POA: Diagnosis not present

## 2017-09-30 DIAGNOSIS — T8543XA Leakage of breast prosthesis and implant, initial encounter: Secondary | ICD-10-CM | POA: Diagnosis not present

## 2017-10-07 DIAGNOSIS — J069 Acute upper respiratory infection, unspecified: Secondary | ICD-10-CM | POA: Diagnosis not present

## 2017-10-07 DIAGNOSIS — Z5189 Encounter for other specified aftercare: Secondary | ICD-10-CM | POA: Diagnosis not present

## 2017-10-13 DIAGNOSIS — Z5189 Encounter for other specified aftercare: Secondary | ICD-10-CM | POA: Diagnosis not present

## 2017-10-20 DIAGNOSIS — Z5189 Encounter for other specified aftercare: Secondary | ICD-10-CM | POA: Diagnosis not present

## 2017-10-24 ENCOUNTER — Ambulatory Visit (INDEPENDENT_AMBULATORY_CARE_PROVIDER_SITE_OTHER): Payer: BLUE CROSS/BLUE SHIELD | Admitting: Neurology

## 2017-10-24 DIAGNOSIS — R51 Headache: Secondary | ICD-10-CM

## 2017-10-24 DIAGNOSIS — G4733 Obstructive sleep apnea (adult) (pediatric): Secondary | ICD-10-CM

## 2017-10-24 DIAGNOSIS — R519 Headache, unspecified: Secondary | ICD-10-CM

## 2017-10-24 DIAGNOSIS — G4719 Other hypersomnia: Secondary | ICD-10-CM

## 2017-10-24 DIAGNOSIS — R0681 Apnea, not elsewhere classified: Secondary | ICD-10-CM

## 2017-10-24 DIAGNOSIS — R351 Nocturia: Secondary | ICD-10-CM

## 2017-10-24 DIAGNOSIS — R0683 Snoring: Secondary | ICD-10-CM

## 2017-10-26 DIAGNOSIS — Z3200 Encounter for pregnancy test, result unknown: Secondary | ICD-10-CM | POA: Diagnosis not present

## 2017-10-26 DIAGNOSIS — B977 Papillomavirus as the cause of diseases classified elsewhere: Secondary | ICD-10-CM | POA: Diagnosis not present

## 2017-10-26 DIAGNOSIS — N72 Inflammatory disease of cervix uteri: Secondary | ICD-10-CM | POA: Diagnosis not present

## 2017-10-27 ENCOUNTER — Telehealth: Payer: Self-pay

## 2017-10-27 ENCOUNTER — Other Ambulatory Visit: Payer: Self-pay | Admitting: Neurology

## 2017-10-27 DIAGNOSIS — Z5189 Encounter for other specified aftercare: Secondary | ICD-10-CM | POA: Diagnosis not present

## 2017-10-27 DIAGNOSIS — G4733 Obstructive sleep apnea (adult) (pediatric): Secondary | ICD-10-CM

## 2017-10-27 NOTE — Telephone Encounter (Signed)
I called pt. I advised pt that Dr. Frances FurbishAthar reviewed their sleep study results and found that patient has moderate OSA with an O2 Nadir of 84% and recommends that pt be treated with a cpap. Dr. Frances FurbishAthar recommends that pt return for a repeat sleep study in order to properly titrate the cpap and ensure a good mask fit. Pt is agreeable to returning for a titration study. I advised pt that our sleep lab will file with pt's insurance and call pt to schedule the sleep study when we hear back from the pt's insurance regarding coverage of this sleep study. Patient is requesting a Sunday night with Beau. Pt verbalized understanding of results. Pt had no questions at this time but was encouraged to call back if questions arise.

## 2017-10-27 NOTE — Progress Notes (Signed)
Patient referred by her GYN, Dr. Ernestina PennaFogleman, seen by me on 08/25/17, diagnostic PSG on 10/24/17.   Please call and notify the patient that the recent sleep study showed moderate obstructive sleep apnea, with a total AHI of 16.5/hour, REM AHI of 5.4/hour, supine AHI of 26.9/hour and O2 nadir of 84%. I recommend treatment for this in the form of CPAP. This will require a repeat sleep study for proper titration and mask fitting and correct monitoring of the oxygen saturations. Please explain to patient. I have placed an order in the chart. Thanks.  Huston FoleySaima Marlayna Bannister, MD, PhD [facility])

## 2017-10-27 NOTE — Procedures (Signed)
PATIENT'S NAME:  Sierra Peters, [name] DOB:      Apr 25, 1961      MR#:    [id number]     DATE OF RECORDING: 10/24/2017 REFERRING M.D.: Dr. Ernestina PennaFogleman,               PCP: Nelwyn SalisburyStephen A Fry MD Study Performed:   Baseline Polysomnogram HISTORY: 57 year old woman with an underlying medical history of arthralgia, allergies, anxiety, depression, recurrent headaches, and mitral valve prolapse, who reports snoring and excessive daytime somnolence. Her Epworth sleepiness score is 12 out of 24. The patient's weight 106 pounds with a height of 63 (inches), resulting in a BMI of 18.8 kg/m2. The patient's neck circumference measured 13.6 inches.  CURRENT MEDICATIONS: Xanax, Clarinex, Dexilant, Lexapro, Vivelle-Dot, Lamictal, Mobic, Medrol Dosepack, Dynacin, Singulair, Allery shot once a week, Ambien   PROCEDURE:  This is a multichannel digital polysomnogram utilizing the Somnostar 11.2 system.  Electrodes and sensors were applied and monitored per AASM Specifications.   EEG, EOG, Chin and Limb EMG, were sampled at 200 Hz.  ECG, Snore and Nasal Pressure, Thermal Airflow, Respiratory Effort, CPAP Flow and Pressure, Oximetry was sampled at 50 Hz. Digital video and audio were recorded.      BASELINE STUDY  Patient took Ambien and Xanax prior to study. Lights Out was at 22:41 and Lights On at 04:59.  Total recording time (TRT) was 379 minutes, with a total sleep time (TST) of  360 minutes.   The patient's sleep latency was 7.5 minutes. REM latency was 109.5 minutes.  The sleep efficiency was 95. %.     SLEEP ARCHITECTURE: WASO (Wake after sleep onset) was 11.5 minutes with minimal sleep fragmentation noted.  There were 29.5 minutes in Stage N1, 125 minutes Stage N2, 105 minutes Stage N3 and 100.5 minutes in Stage REM.  The percentage of Stage N1 was 8.2%, Stage N2 was 34.7%, Stage N3 was 29.2%, which is mildly increased, and Stage R (REM sleep) was 27.9%, which is mildly increased. The arousals were noted as: 15 were spontaneous, 0  were associated with PLMs, 80 were associated with respiratory events.  Audio and video analysis did not show any abnormal or unusual movements, behaviors, phonations or vocalizations. The patient took 1 bathroom break. Mild snoring was noted. The EKG was in keeping with normal sinus rhythm (NSR).  RESPIRATORY ANALYSIS:  There were a total of 99 respiratory events:  15 obstructive apneas, 0 central apneas and 0 mixed apneas with a total of 15 apneas and an apnea index (AI) of 2.5 /hour. There were 84 hypopneas with a hypopnea index of 14 /hour. The patient also had 0 respiratory event related arousals (RERAs).      The total APNEA/HYPOPNEA INDEX (AHI) was 16.5/hour and the total RESPIRATORY DISTURBANCE INDEX was 16.5 /hour.  9 events occurred in REM sleep and 150 events in NREM. The REM AHI was 5.4 /hour, versus a non-REM AHI of 20.8. The patient spent 151.5 minutes of total sleep time in the supine position and 209 minutes in non-supine.. The supine AHI was 26.9 versus a non-supine AHI of 8.9.  OXYGEN SATURATION & C02:  The Wake baseline 02 saturation was 98%, with the lowest being 84%. Time spent below 89% saturation equaled 6 minutes.  PERIODIC LIMB MOVEMENTS: The patient had a total of 0 Periodic Limb Movements.  The Periodic Limb Movement (PLM) index was 0 and the PLM Arousal index was 0/hour.  Post-study, the patient indicated that sleep was the same as usual.  IMPRESSION:  1. Obstructive Sleep Apnea (OSA)  RECOMMENDATIONS:  1. This study demonstrates moderate obstructive sleep apnea, with a total AHI of 16.5/hour, REM AHI of 5.4/hour, supine AHI of 26.9/hour and O2 nadir of 84%. Treatment with positive airway pressure in the form of CPAP is recommended. This will require a full night titration study to optimize therapy. Other treatment options may include avoidance of supine sleep position along with weight loss, upper airway or jaw surgery in selected patients or the use of an oral  appliance in certain patients. ENT evaluation and/or consultation with a maxillofacial surgeon or dentist may be feasible in some instances.    2. Please note that untreated obstructive sleep apnea carries additional perioperative morbidity. Patients with significant obstructive sleep apnea should receive perioperative PAP therapy and the surgeons and particularly the anesthesiologist should be informed of the diagnosis and the severity of the sleep disordered breathing. 3. The patient should be cautioned not to drive, work at heights, or operate dangerous or heavy equipment when tired or sleepy. Review and reiteration of good sleep hygiene measures should be pursued with any patient. 4. The patient will be seen in follow-up by Dr. Frances Furbish at [facility] for discussion of the test results and further management strategies. The referring provider will be notified of the test results.  I certify that I have reviewed the entire raw data recording prior to the issuance of this report in accordance with the Standards of Accreditation of the American Academy of Sleep Medicine (AASM)   Huston Foley, MD, PhD Diplomat, American Board of Psychiatry and Neurology (Neurology and Sleep Medicine)

## 2017-10-27 NOTE — Telephone Encounter (Signed)
-----   Message from Huston FoleySaima Athar, MD sent at 10/27/2017  7:45 AM EST ----- Patient referred by her GYN, Dr. Ernestina PennaFogleman, seen by me on 08/25/17, diagnostic PSG on 10/24/17.   Please call and notify the patient that the recent sleep study showed moderate obstructive sleep apnea, with a total AHI of 16.5/hour, REM AHI of 5.4/hour, supine AHI of 26.9/hour and O2 nadir of 84%. I recommend treatment for this in the form of CPAP. This will require a repeat sleep study for proper titration and mask fitting and correct monitoring of the oxygen saturations. Please explain to patient. I have placed an order in the chart. Thanks.  Huston FoleySaima Athar, MD, PhD [facility]

## 2017-10-27 NOTE — Telephone Encounter (Signed)
Called patient and LVM asking for call back. Left office number and hours on message.

## 2017-11-02 ENCOUNTER — Ambulatory Visit: Payer: Self-pay | Admitting: Family Medicine

## 2017-11-10 ENCOUNTER — Telehealth: Payer: Self-pay

## 2017-11-10 DIAGNOSIS — Z5189 Encounter for other specified aftercare: Secondary | ICD-10-CM | POA: Diagnosis not present

## 2017-11-10 DIAGNOSIS — G4733 Obstructive sleep apnea (adult) (pediatric): Secondary | ICD-10-CM

## 2017-11-10 NOTE — Telephone Encounter (Signed)
[employer] denied cpap titration. Can we send order for Auto cpap to DME. We will let patient know because we have her scheduled for a cpap titration.

## 2017-11-16 NOTE — Telephone Encounter (Signed)
We will set patient up with autoPAP at home, as insurance denied in house titration study for OSA. Pls process order and notify patient and set up FU in 10 weeks with me or NP.     

## 2017-11-16 NOTE — Telephone Encounter (Signed)
I called pt. I advised pt that her cpap titration was denied. Dr. Frances FurbishAthar recommends that pt start an auto pap. I reviewed PAP compliance expectations with the pt. Pt is agreeable to starting an auto-PAP. I advised pt that an order will be sent to a DME, [facility], and [facility] will call the pt within about one week after they file with the pt's insurance. [facility] will show the pt how to use the machine, fit for masks, and troubleshoot the auto-PAP if needed. A follow up appt was made for insurance purposes with Dr. Frances FurbishAthar on 02/02/18 at 11:30am. Pt verbalized understanding to arrive 15 minutes early and bring their auto-PAP. A letter with all of this information in it will be sent to the pt's mychart account as a reminder. I verified with the pt that the address we have on file is correct. Pt verbalized understanding of results. Pt had no questions at this time but was encouraged to call back if questions arise.

## 2017-11-18 DIAGNOSIS — Z5189 Encounter for other specified aftercare: Secondary | ICD-10-CM | POA: Diagnosis not present

## 2017-11-24 DIAGNOSIS — Z5189 Encounter for other specified aftercare: Secondary | ICD-10-CM | POA: Diagnosis not present

## 2017-12-01 ENCOUNTER — Other Ambulatory Visit: Payer: Self-pay | Admitting: Family Medicine

## 2017-12-01 DIAGNOSIS — Z5189 Encounter for other specified aftercare: Secondary | ICD-10-CM | POA: Diagnosis not present

## 2017-12-02 ENCOUNTER — Other Ambulatory Visit: Payer: Self-pay | Admitting: Family Medicine

## 2017-12-02 NOTE — Telephone Encounter (Signed)
Last Ov 10/21/2016   Last refilled 05/04/2017 disp 30 with 5 refills   Pt needs an appt

## 2017-12-02 NOTE — Telephone Encounter (Signed)
Copied from CRM [id number]. Topic: Quick Communication - See Telephone Encounter >> Dec 02, 2017  9:37 AM Arlyss Gandyichardson, Jovannie Ulibarri N, NT wrote: CRM for notification. See Telephone encounter for: Pt is scheduled for an appointment with Dr. Clent RidgesFry on 12/08/17 and would like to see if she can get a partial refill of zolpidem (AMBIEN) to last until then? U[facility] [address]   12/02/17.

## 2017-12-02 NOTE — Telephone Encounter (Signed)
Upcoming visit on 12/08/17, requesting enough Ambien to get her to the appointment.    PCP: Clent RidgesFry Pharmacy: [facility] [place] - [address] [phone] (Phone) [phone] (Fax)

## 2017-12-03 NOTE — Telephone Encounter (Signed)
Called pt and left a VM that Dr. Clent RidgesFry has been out of the office yesterday and today and will return on Monday 3/18. Advised pt to call back if she can NOT afford to wait until Monday and we can see if another provider will consistor to refill.

## 2017-12-03 NOTE — Telephone Encounter (Signed)
Sent to PCP for approval.  

## 2017-12-06 NOTE — Telephone Encounter (Signed)
Call in #30 with 5 rf 

## 2017-12-07 MED ORDER — ZOLPIDEM TARTRATE 10 MG PO TABS: 10.0000 mg | ORAL_TABLET | Freq: Every evening | ORAL | 5 refills | Status: DC | PRN

## 2017-12-07 NOTE — Telephone Encounter (Signed)
Rx called into pharmacy spoke with Selena BattenKim at the pharmacy.

## 2017-12-08 ENCOUNTER — Ambulatory Visit: Payer: BLUE CROSS/BLUE SHIELD | Admitting: Family Medicine

## 2017-12-08 ENCOUNTER — Encounter: Payer: Self-pay | Admitting: Family Medicine

## 2017-12-08 VITALS — BP 102/70 | HR 83 | Temp 98.4°F | Wt 103.2 lb

## 2017-12-08 DIAGNOSIS — Z5189 Encounter for other specified aftercare: Secondary | ICD-10-CM | POA: Diagnosis not present

## 2017-12-08 DIAGNOSIS — F5101 Primary insomnia: Secondary | ICD-10-CM

## 2017-12-08 DIAGNOSIS — G4733 Obstructive sleep apnea (adult) (pediatric): Secondary | ICD-10-CM | POA: Diagnosis not present

## 2017-12-08 DIAGNOSIS — R300 Dysuria: Secondary | ICD-10-CM | POA: Diagnosis not present

## 2017-12-08 DIAGNOSIS — N3090 Cystitis, unspecified without hematuria: Secondary | ICD-10-CM | POA: Diagnosis not present

## 2017-12-08 DIAGNOSIS — G4700 Insomnia, unspecified: Secondary | ICD-10-CM | POA: Insufficient documentation

## 2017-12-08 NOTE — Progress Notes (Signed)
   Subjective:    Patient ID: Sierra HarmanDeborah Digman, female    DOB: 08-Feb-1961, 57 y.o.   MRN: [id number]  HPI Here to discuss refills on Zolpidem and also on a recent diagnosis of obstructive sleep apnea. She has been seeing [facility] Hust[facility]Saima Athar [facility] for witnessed apneic spells and snoring, and the sleep study revealed numerous apneic spells with a low O2 level at 84%. She is being fitted for a CPAP machine next week. She has heard that some patients can have surgery to remove tonsils, etc, and can avoid using the CPAP.    Review of Systems  Constitutional: Negative.   Respiratory: Negative.   Cardiovascular: Negative.   Neurological: Negative.        Objective:   Physical Exam  Constitutional: She is oriented to person, place, and time. She appears well-developed and well-nourished.  HENT:  Nose: Nose normal.  Her posterior OP is extremely narrowed with large tonsils and uvula present   Neck: No thyromegaly present.  Cardiovascular: Normal rate, regular rhythm, normal heart sounds and intact distal pulses.  Pulmonary/Chest: Effort normal and breath sounds normal. No respiratory distress. She has no wheezes. She has no rales.  Lymphadenopathy:    She has no cervical adenopathy.  Neurological: She is alert and oriented to person, place, and time.          Assessment & Plan:  For the insomnia, the Zolpidem is refilled. She definitely has sleep apnea. I think she may be a good candidate for a uvulopalatoplasty so we will refer her to ENT for a consultation.  Gershon CraneStephen Zayneb Baucum, MD

## 2017-12-15 DIAGNOSIS — Z5189 Encounter for other specified aftercare: Secondary | ICD-10-CM | POA: Diagnosis not present

## 2017-12-16 DIAGNOSIS — G4733 Obstructive sleep apnea (adult) (pediatric): Secondary | ICD-10-CM | POA: Diagnosis not present

## 2017-12-22 DIAGNOSIS — Z5189 Encounter for other specified aftercare: Secondary | ICD-10-CM | POA: Diagnosis not present

## 2017-12-24 DIAGNOSIS — K219 Gastro-esophageal reflux disease without esophagitis: Secondary | ICD-10-CM | POA: Diagnosis not present

## 2017-12-24 DIAGNOSIS — J3089 Other allergic rhinitis: Secondary | ICD-10-CM | POA: Diagnosis not present

## 2017-12-24 DIAGNOSIS — Z9989 Dependence on other enabling machines and devices: Secondary | ICD-10-CM | POA: Diagnosis not present

## 2017-12-24 DIAGNOSIS — J342 Deviated nasal septum: Secondary | ICD-10-CM | POA: Diagnosis not present

## 2017-12-24 DIAGNOSIS — F329 Major depressive disorder, single episode, unspecified: Secondary | ICD-10-CM | POA: Diagnosis not present

## 2017-12-24 DIAGNOSIS — G4733 Obstructive sleep apnea (adult) (pediatric): Secondary | ICD-10-CM | POA: Diagnosis not present

## 2017-12-24 DIAGNOSIS — J302 Other seasonal allergic rhinitis: Secondary | ICD-10-CM | POA: Diagnosis not present

## 2017-12-24 DIAGNOSIS — M19041 Primary osteoarthritis, right hand: Secondary | ICD-10-CM | POA: Diagnosis not present

## 2018-01-03 DIAGNOSIS — Z5189 Encounter for other specified aftercare: Secondary | ICD-10-CM | POA: Diagnosis not present

## 2018-01-08 ENCOUNTER — Emergency Department (HOSPITAL_BASED_OUTPATIENT_CLINIC_OR_DEPARTMENT_OTHER): Payer: BLUE CROSS/BLUE SHIELD

## 2018-01-08 ENCOUNTER — Encounter (HOSPITAL_BASED_OUTPATIENT_CLINIC_OR_DEPARTMENT_OTHER): Payer: Self-pay | Admitting: Emergency Medicine

## 2018-01-08 ENCOUNTER — Emergency Department (HOSPITAL_BASED_OUTPATIENT_CLINIC_OR_DEPARTMENT_OTHER)
Admission: EM | Admit: 2018-01-08 | Discharge: 2018-01-08 | Disposition: A | Discharge status: Home / Self Care | Payer: BLUE CROSS/BLUE SHIELD | Attending: Emergency Medicine | Admitting: Emergency Medicine

## 2018-01-08 ENCOUNTER — Other Ambulatory Visit: Payer: Self-pay

## 2018-01-08 DIAGNOSIS — R51 Headache: Secondary | ICD-10-CM | POA: Diagnosis not present

## 2018-01-08 DIAGNOSIS — M542 Cervicalgia: Secondary | ICD-10-CM | POA: Diagnosis not present

## 2018-01-08 DIAGNOSIS — G44209 Tension-type headache, unspecified, not intractable: Secondary | ICD-10-CM | POA: Diagnosis not present

## 2018-01-08 DIAGNOSIS — Z79899 Other long term (current) drug therapy: Secondary | ICD-10-CM | POA: Diagnosis not present

## 2018-01-08 DIAGNOSIS — R519 Headache, unspecified: Secondary | ICD-10-CM

## 2018-01-08 MED ORDER — SODIUM CHLORIDE 0.9 % IV BOLUS
1000.0000 mL | Freq: Once | INTRAVENOUS | Status: AC
  Administered 2018-01-08: 1000 mL via INTRAVENOUS

## 2018-01-08 MED ORDER — METOCLOPRAMIDE HCL 5 MG/ML IJ SOLN
10.0000 mg | Freq: Once | INTRAMUSCULAR | Status: AC
Start: 2018-01-08 — End: 2018-01-08
  Administered 2018-01-08: 10 mg via INTRAVENOUS
  Filled 2018-01-08: qty 2

## 2018-01-08 MED ORDER — KETOROLAC TROMETHAMINE 30 MG/ML IJ SOLN
30.0000 mg | Freq: Once | INTRAMUSCULAR | Status: AC
  Administered 2018-01-08: 30 mg via INTRAVENOUS
  Filled 2018-01-08: qty 1

## 2018-01-08 MED ORDER — HYDROMORPHONE HCL 1 MG/ML IJ SOLN
0.5000 mg | Freq: Once | INTRAMUSCULAR | Status: AC
  Administered 2018-01-08: 0.5 mg via INTRAVENOUS
  Filled 2018-01-08: qty 1

## 2018-01-08 MED ORDER — TRAMADOL HCL 50 MG PO TABS: 50.0000 mg | ORAL_TABLET | Freq: Four times a day (QID) | ORAL | 0 refills | Status: DC | PRN

## 2018-01-08 MED ORDER — DIPHENHYDRAMINE HCL 50 MG/ML IJ SOLN
25.0000 mg | Freq: Once | INTRAMUSCULAR | Status: AC
  Administered 2018-01-08: 25 mg via INTRAVENOUS
  Filled 2018-01-08: qty 1

## 2018-01-08 NOTE — Discharge Instructions (Addendum)
It was our pleasure to provide your ER care today - we hope that you feel better.  Rest. Try heat therapy and gentle massage to posterior neck and scalp area for symptom improvement.   Take ibuprofen or excedrin as need for pain. You may also take ultram as need for pain - no driving for the next 4 hours or when taking ultram.  Follow up with primary care doctor in the next few days if symptoms fail to improve/resolve.  Return to ER if worse, new symptoms, persistent vomiting, severe/intractable pain, other concern.

## 2018-01-08 NOTE — ED Triage Notes (Signed)
Pt c/o neck pain, HA since Wed; vomiting today; [facility] and received Toradol, Zofran and Norflex injections (10 am); had no relief and was referred here.

## 2018-01-08 NOTE — ED Notes (Signed)
ED Provider at bedside. 

## 2018-01-08 NOTE — ED Provider Notes (Signed)
[facility] EMERGENCY DEPARTMENT Provider Note   CSN: [id number] Arrival date & time: 01/08/18  1751     History   Chief Complaint Chief Complaint  Patient presents with  . Headache  . Emesis    HPI Sierra Peters is a 57 y.o. female.  Patient c/o headache for past few days, diffuse, dull, mod-severe, but indicates length of duration is unlike her prior headaches, and she is concerned. No trauma to head. No syncope. Some recent allergy symptoms, but denies any purulent sinus drainage, no uri c/o, no fevers. Denies eye pain or change in vision. No neck pain or stiffness. Denies fever or chills.  No numbness or weakness. No change in speech. +nausea with a few episodes of emesis, not bloody or bilious. No abd pain.   The history is provided by the patient.  Headache   Associated symptoms include nausea and vomiting. Pertinent negatives include no fever and no shortness of breath.  Neck Pain   Associated symptoms include headaches. Pertinent negatives include no chest pain, no numbness and no weakness.  Emesis   Associated symptoms include headaches. Pertinent negatives include no abdominal pain, no chills and no fever.    Past Medical History:  Diagnosis Date  . Allergy    sees Dr. Corinda Gubler  . Anxiety   . Chronic fatigue   . Depression   . Headache(784.0)   . MVP (mitral valve prolapse)     Patient Active Problem List   Diagnosis Date Noted  . Insomnia 12/08/2017  . Sleep apnea, obstructive 12/08/2017  . Anxiety state 03/05/2009  . ALLERGIC RHINITIS, CHRONIC 07/25/2008  . Depression 05/16/2007  . HEADACHE 05/16/2007    Past Surgical History:  Procedure Laterality Date  . APPENDECTOMY    . BREAST SURGERY     augmentation per Dr. Stephens November  . COLONOSCOPY  02/07/2013   per Dr. Ewing Schlein, sigmoid diverticula, no polyps, repeat in 10 yrs   . ESOPHAGOGASTRODUODENOSCOPY  02/07/2013   per Dr. Ewing Schlein, esophageal erosions, hiatal hernia   . TUBAL LIGATION        OB History   None      Home Medications    Prior to Admission medications   Medication Sig Start Date End Date Taking? Authorizing Provider  ALPRAZolam Prudy Feeler) 0.5 MG tablet TAKE 1 TABLET 3 TIMES A DAY AS NEEDED. 09/08/17   Nelwyn Salisbury, MD  desloratadine-pseudoephedrine (CLARINEX-D 24-HOUR) 5-240 MG per 24 hr tablet Take 1 tablet by mouth daily.     [provider]  Dexlansoprazole (DEXILANT PO) Take by mouth daily.    [provider]  escitalopram (LEXAPRO) 20 MG tablet Take 1 tablet (20 mg total) by mouth daily. 09/04/14   Nelwyn Salisbury, MD  estradiol (VIVELLE-DOT) 0.05 MG/24HR patch Place 1 patch onto the skin 2 (two) times a week.    [provider]  lamoTRIgine (LAMICTAL) 100 MG tablet Take 1 tablet (100 mg total) by mouth daily. 09/04/14   Nelwyn Salisbury, MD  meloxicam (MOBIC) 15 MG tablet TAKE 1 TABLET DAILY 06/25/14   Nelwyn Salisbury, MD  methylPREDNISolone (MEDROL DOSEPAK) 4 MG TBPK tablet As directed 10/21/16   Nelwyn Salisbury, MD  minocycline (DYNACIN) 100 MG tablet Take 100 mg by mouth once.      [provider]  montelukast (SINGULAIR) 10 MG tablet Take 10 mg by mouth at bedtime.    [provider]  NON FORMULARY Allergy shot 1 x week    [provider]  progesterone (PROMETRIUM) 100 MG capsule Take 100 mg by mouth daily.    [provider]  zolpidem (AMBIEN) 10 MG tablet Take 1 tablet (10 mg total) by mouth at bedtime as needed. 12/07/17   Nelwyn SalisburyFry, Stephen A, MD    Family History Family History  Problem Relation Age of Onset  . Arthritis Unknown   . Heart disease Unknown        cardiovascular disorder    Social History Social History   Tobacco Use  . Smoking status: Never Smoker  . Smokeless tobacco: Never Used  Substance Use Topics  . Alcohol use: Yes    Alcohol/week: 1.2 oz    Types: 2 Standard drinks or equivalent per week  . Drug use: No     Allergies   Ciprofloxacin and  Erythromycin   Review of Systems Review of Systems  Constitutional: Negative for chills and fever.  HENT: Negative for sinus pressure.   Eyes: Negative for pain and visual disturbance.  Respiratory: Negative for shortness of breath.   Cardiovascular: Negative for chest pain.  Gastrointestinal: Positive for nausea and vomiting. Negative for abdominal pain.  Genitourinary: Negative for flank pain.  Musculoskeletal: Negative for back pain and neck stiffness.  Skin: Negative for rash.  Neurological: Positive for headaches. Negative for weakness and numbness.  Hematological: Does not bruise/bleed easily.  Psychiatric/Behavioral: Negative for confusion.     Physical Exam Updated Vital Signs BP (!) 129/100   Pulse 88   Temp 97.7 F (36.5 C) (Oral)   Resp 20   Ht 1.626 m (5\' 4" )   Wt 46.7 kg (103 lb)   SpO2 100%   BMI 17.68 kg/m   Physical Exam  Constitutional: She appears well-developed and well-nourished. No distress.  HENT:  Head: Atraumatic.  Nose: Nose normal.  Mouth/Throat: Oropharynx is clear and moist.  No sinus or temporal tenderness. Posterior scalp and occipital area muscular tenderness, no trigger point, no sts.   Eyes: Pupils are equal, round, and reactive to light. Conjunctivae and EOM are normal. No scleral icterus.  Neck: Neck supple. No tracheal deviation present. No thyromegaly present.  No stiffness or rigidity.   Cardiovascular: Normal rate, regular rhythm, normal heart sounds and intact distal pulses. Exam reveals no gallop and no friction rub.  No murmur heard. Pulmonary/Chest: Effort normal and breath sounds normal. No respiratory distress.  Abdominal: Soft. Normal appearance and bowel sounds are normal. She exhibits no distension. There is no tenderness.  Genitourinary:  Genitourinary Comments: No cva tenderness.  Musculoskeletal: Normal range of motion. She exhibits no edema or tenderness.  Neurological: She is alert. No cranial nerve deficit.   Speech clear/fluent. Motor intact bilaterally. Steady gait.   Skin: Skin is warm and dry. No rash noted. She is not diaphoretic.  Psychiatric: She has a normal mood and affect.  Nursing note and vitals reviewed.    ED Treatments / Results  Labs (all labs ordered are listed, but only abnormal results are displayed) Results for orders placed or performed in visit on 10/21/16  CBC with Differential/Platelet  Result Value Ref Range   WBC 5.1 4.0 - 10.5 K/uL   RBC 3.99 3.87 - 5.11 Mil/uL   Hemoglobin 13.3 12.0 - 15.0 g/dL   HCT 16.139.8 09.636.0 - 04.546.0 %   MCV 99.8 78.0 - 100.0 fl   MCHC 33.5 30.0 - 36.0 g/dL   RDW 40.914.3 81.111.5 - 91.415.5 %   Platelets 295.0 150.0 - 400.0 K/uL   Neutrophils Relative %  65.9 43.0 - 77.0 %   Lymphocytes Relative 23.3 12.0 - 46.0 %   Monocytes Relative 8.7 3.0 - 12.0 %   Eosinophils Relative 1.4 0.0 - 5.0 %   Basophils Relative 0.7 0.0 - 3.0 %   Neutro Abs 3.4 1.4 - 7.7 K/uL   Lymphs Abs 1.2 0.7 - 4.0 K/uL   Monocytes Absolute 0.4 0.1 - 1.0 K/uL   Eosinophils Absolute 0.1 0.0 - 0.7 K/uL   Basophils Absolute 0.0 0.0 - 0.1 K/uL  Sedimentation rate  Result Value Ref Range   Sed Rate 1 0 - 30 mm/hr  Rheumatoid factor  Result Value Ref Range   Rhuematoid fact SerPl-aCnc <14 <14 IU/mL  C-reactive protein  Result Value Ref Range   CRP 0.1 (L) 0.5 - 20.0 mg/dL  ANA  Result Value Ref Range   Anit Nuclear Antibody(ANA) NEG NEGATIVE   Ct Head Wo Contrast  Result Date: 01/08/2018 CLINICAL DATA:  Headache EXAM: CT HEAD WITHOUT CONTRAST TECHNIQUE: Contiguous axial images were obtained from the base of the skull through the vertex without intravenous contrast. COMPARISON:  None. FINDINGS: Brain: No acute intracranial abnormality. Specifically, no hemorrhage, hydrocephalus, mass lesion, acute infarction, or significant intracranial injury. Vascular: No hyperdense vessel or unexpected calcification. Skull: No acute calvarial abnormality. Sinuses/Orbits: Visualized paranasal  sinuses and mastoids clear. Orbital soft tissues unremarkable. Other: None IMPRESSION: No acute intracranial abnormality. Electronically Signed   By: Charlett Nose M.D.   On: 01/08/2018 20:01    EKG None  Radiology No results found.  Procedures Procedures (including critical care time)  Medications Ordered in ED Medications  sodium chloride 0.9 % bolus 1,000 mL (has no administration in time range)  diphenhydrAMINE (BENADRYL) injection 25 mg (has no administration in time range)  metoCLOPramide (REGLAN) injection 10 mg (has no administration in time range)     Initial Impression / Assessment and Plan / ED Course  I have reviewed the triage vital signs and the nursing notes.  Pertinent labs & imaging results that were available during my care of the patient were reviewed by me and considered in my medical decision making (see chart for details).  Iv ns bolus. reglan iv. Benadryl iv.  CT.  Reviewed nursing notes and prior charts for additional history.   Ct reviewed - no acute process.  Persistent pain.   Dilaudid .5 mg iv. toradol iv.  Patient appears comfortable, nad.  Discussed ct w pt.  rec close pcp f/u.     Final Clinical Impressions(s) / ED Diagnoses   Final diagnoses:  None    ED Discharge Orders    None       Cathren Laine, MD 01/08/18 2253

## 2018-01-10 ENCOUNTER — Ambulatory Visit: Payer: BLUE CROSS/BLUE SHIELD | Admitting: Family Medicine

## 2018-01-10 ENCOUNTER — Encounter: Payer: Self-pay | Admitting: Family Medicine

## 2018-01-10 VITALS — BP 100/70 | HR 90 | Temp 98.2°F | Ht 64.0 in | Wt 102.0 lb

## 2018-01-10 DIAGNOSIS — M542 Cervicalgia: Secondary | ICD-10-CM

## 2018-01-10 DIAGNOSIS — R51 Headache: Secondary | ICD-10-CM | POA: Diagnosis not present

## 2018-01-10 DIAGNOSIS — R519 Headache, unspecified: Secondary | ICD-10-CM

## 2018-01-10 MED ORDER — METHYLPREDNISOLONE ACETATE 80 MG/ML IJ SUSP
120.0000 mg | Freq: Once | INTRAMUSCULAR | Status: AC
  Administered 2018-01-10: 120 mg via INTRAMUSCULAR

## 2018-01-10 MED ORDER — SUMATRIPTAN SUCCINATE 100 MG PO TABS: 100.0000 mg | ORAL_TABLET | ORAL | 2 refills | Status: DC | PRN

## 2018-01-10 MED ORDER — TRAMADOL HCL 50 MG PO TABS: 100.0000 mg | ORAL_TABLET | Freq: Four times a day (QID) | ORAL | 2 refills | Status: DC | PRN

## 2018-01-10 MED ORDER — DIAZEPAM 5 MG PO TABS: 5.0000 mg | ORAL_TABLET | Freq: Three times a day (TID) | ORAL | 0 refills | Status: DC | PRN

## 2018-01-10 NOTE — Progress Notes (Signed)
   Subjective:    Patient ID: Sierra Peters, female    DOB: [birth year], 57 y.o.   MRN: [id number]  HPI Here for several days of pain and tension in the back of the neck that travels up the back of the head and over the top of the head. No recent trauma but it started shortly [place] She went to an urgent care and then an ER on 01-08-18. She had a normal non-contrasted CT of the head. She was given shots of Toradol and Reglan, and prescriptions to take Norflex and Tramadol for pain and Zofran for nausea. None of these have helped much. She denies any blurred vision or any neurologic deficits. She notes that she had migraines when in her 2320s and 30s. She also takes Meloxicam daily as usual.    Review of Systems  Constitutional: Negative.   Eyes: Negative.   Respiratory: Negative.   Musculoskeletal: Positive for neck pain and neck stiffness.  Neurological: Positive for headaches.       Objective:   Physical Exam  Constitutional: She is oriented to person, place, and time. She appears well-developed and well-nourished. No distress.  HENT:  Head: Normocephalic and atraumatic.  Neck: Neck supple. No thyromegaly present.  Cardiovascular: Normal rate, regular rhythm, normal heart sounds and intact distal pulses.  Pulmonary/Chest: Effort normal and breath sounds normal. No respiratory distress. She has no wheezes. She has no rales.  Musculoskeletal:  She is very tender overt he posterior neck. ROM is full   Lymphadenopathy:    She has no cervical adenopathy.  Neurological: She is alert and oriented to person, place, and time. No cranial nerve deficit. Coordination normal.          Assessment & Plan:  Tension headaches with a possible element of migraines as well. She will increase the Tramadol to 100 mg at a time every 6 hours prn. Use Valium instead of Xanax for muscle tension. Use the Zofran prn. Add Imitrex 100 mg as needed. Set up a non-contrasted MRI of the  cervical spine. Gershon CraneStephen Fry, MD

## 2018-01-12 DIAGNOSIS — Z5189 Encounter for other specified aftercare: Secondary | ICD-10-CM | POA: Diagnosis not present

## 2018-01-16 DIAGNOSIS — G4733 Obstructive sleep apnea (adult) (pediatric): Secondary | ICD-10-CM | POA: Diagnosis not present

## 2018-01-19 DIAGNOSIS — Z5189 Encounter for other specified aftercare: Secondary | ICD-10-CM | POA: Diagnosis not present

## 2018-01-20 ENCOUNTER — Ambulatory Visit
Admission: RE | Admit: 2018-01-20 | Discharge: 2018-01-20 | Disposition: A | Discharge status: Home / Self Care | Payer: BLUE CROSS/BLUE SHIELD | Source: Ambulatory Visit | Attending: Family Medicine | Admitting: Family Medicine

## 2018-01-20 ENCOUNTER — Inpatient Hospital Stay: Admission: RE | Admit: 2018-01-20 | Payer: Self-pay | Source: Ambulatory Visit

## 2018-01-20 DIAGNOSIS — R51 Headache: Principal | ICD-10-CM

## 2018-01-20 DIAGNOSIS — M4802 Spinal stenosis, cervical region: Secondary | ICD-10-CM | POA: Diagnosis not present

## 2018-01-20 DIAGNOSIS — R519 Headache, unspecified: Secondary | ICD-10-CM

## 2018-01-24 NOTE — Addendum Note (Signed)
Addended by: Gershon Crane A on: 01/24/2018 09:45 AM   Modules accepted: Orders

## 2018-01-26 DIAGNOSIS — Z5189 Encounter for other specified aftercare: Secondary | ICD-10-CM | POA: Diagnosis not present

## 2018-01-31 ENCOUNTER — Telehealth: Payer: Self-pay | Admitting: Neurology

## 2018-01-31 NOTE — Telephone Encounter (Signed)
Received this notice from [facility]: " She can definitely reschedule for an appointment further out and it will not cause any issues with her insurance.  Please let me know if there is anything else I can help you with."

## 2018-01-31 NOTE — Telephone Encounter (Signed)
I reached out to [facility] to find out if we can postpone pt's appt without pt's insurance giving her problems.

## 2018-01-31 NOTE — Telephone Encounter (Signed)
Received this notice back from [facility]: "That will work.  Thank you for keeping me up to date"

## 2018-01-31 NOTE — Telephone Encounter (Signed)
I called pt. She is struggling to find a good mask fit and she has already tried 2 masks. We agreed to push her appt out until 03/29/18 and I will ask [facility] of this is ok, and I will call pt back if this appt is not soon enough. I cancelled pt's appt on 02/02/18. Pt verbalized understanding of these recommendations.

## 2018-01-31 NOTE — Telephone Encounter (Signed)
Pt requesting a call back to discuss upcoming appt. Stating she has been working with [facility] to figure out the right mask for her. They are wanting her to try one other before coming in on 5/15 pt requesting to push appt back. Please call to advise

## 2018-02-02 ENCOUNTER — Ambulatory Visit: Payer: Self-pay | Admitting: Neurology

## 2018-02-02 DIAGNOSIS — Z5189 Encounter for other specified aftercare: Secondary | ICD-10-CM | POA: Diagnosis not present

## 2018-02-10 DIAGNOSIS — J3089 Other allergic rhinitis: Secondary | ICD-10-CM | POA: Diagnosis not present

## 2018-02-10 DIAGNOSIS — Z5189 Encounter for other specified aftercare: Secondary | ICD-10-CM | POA: Diagnosis not present

## 2018-02-11 DIAGNOSIS — F4323 Adjustment disorder with mixed anxiety and depressed mood: Secondary | ICD-10-CM | POA: Diagnosis not present

## 2018-02-15 DIAGNOSIS — G4733 Obstructive sleep apnea (adult) (pediatric): Secondary | ICD-10-CM | POA: Diagnosis not present

## 2018-02-15 DIAGNOSIS — F4323 Adjustment disorder with mixed anxiety and depressed mood: Secondary | ICD-10-CM | POA: Diagnosis not present

## 2018-02-16 DIAGNOSIS — Z5189 Encounter for other specified aftercare: Secondary | ICD-10-CM | POA: Diagnosis not present

## 2018-02-22 DIAGNOSIS — M542 Cervicalgia: Secondary | ICD-10-CM | POA: Diagnosis not present

## 2018-02-23 DIAGNOSIS — Z5189 Encounter for other specified aftercare: Secondary | ICD-10-CM | POA: Diagnosis not present

## 2018-02-23 DIAGNOSIS — F4323 Adjustment disorder with mixed anxiety and depressed mood: Secondary | ICD-10-CM | POA: Diagnosis not present

## 2018-03-02 DIAGNOSIS — F4323 Adjustment disorder with mixed anxiety and depressed mood: Secondary | ICD-10-CM | POA: Diagnosis not present

## 2018-03-02 DIAGNOSIS — Z5189 Encounter for other specified aftercare: Secondary | ICD-10-CM | POA: Diagnosis not present

## 2018-03-09 DIAGNOSIS — Z5189 Encounter for other specified aftercare: Secondary | ICD-10-CM | POA: Diagnosis not present

## 2018-03-16 DIAGNOSIS — Z5189 Encounter for other specified aftercare: Secondary | ICD-10-CM | POA: Diagnosis not present

## 2018-03-23 DIAGNOSIS — Z5189 Encounter for other specified aftercare: Secondary | ICD-10-CM | POA: Diagnosis not present

## 2018-03-29 ENCOUNTER — Ambulatory Visit: Payer: Self-pay | Admitting: Neurology

## 2018-03-30 ENCOUNTER — Encounter: Payer: Self-pay | Admitting: Neurology

## 2018-03-30 ENCOUNTER — Encounter: Payer: Self-pay | Admitting: Adult Health

## 2018-03-30 DIAGNOSIS — Z5189 Encounter for other specified aftercare: Secondary | ICD-10-CM | POA: Diagnosis not present

## 2018-04-06 DIAGNOSIS — J3089 Other allergic rhinitis: Secondary | ICD-10-CM | POA: Diagnosis not present

## 2018-04-06 DIAGNOSIS — Z5189 Encounter for other specified aftercare: Secondary | ICD-10-CM | POA: Diagnosis not present

## 2018-04-13 DIAGNOSIS — Z5189 Encounter for other specified aftercare: Secondary | ICD-10-CM | POA: Diagnosis not present

## 2018-04-13 DIAGNOSIS — Z0000 Encounter for general adult medical examination without abnormal findings: Secondary | ICD-10-CM | POA: Diagnosis not present

## 2018-04-14 ENCOUNTER — Other Ambulatory Visit: Payer: Self-pay | Admitting: Family Medicine

## 2018-04-15 NOTE — Telephone Encounter (Signed)
Call in #90 with 5 rf 

## 2018-04-20 DIAGNOSIS — Z760 Encounter for issue of repeat prescription: Secondary | ICD-10-CM | POA: Diagnosis not present

## 2018-04-20 DIAGNOSIS — J309 Allergic rhinitis, unspecified: Secondary | ICD-10-CM | POA: Diagnosis not present

## 2018-04-20 DIAGNOSIS — F329 Major depressive disorder, single episode, unspecified: Secondary | ICD-10-CM | POA: Diagnosis not present

## 2018-04-20 DIAGNOSIS — K219 Gastro-esophageal reflux disease without esophagitis: Secondary | ICD-10-CM | POA: Diagnosis not present

## 2018-04-22 ENCOUNTER — Other Ambulatory Visit: Payer: Self-pay | Admitting: Family Medicine

## 2018-04-22 NOTE — Telephone Encounter (Signed)
Rx not on current med list Last fill 09/08/17 Last OV 01/10/18  Ok to fill?

## 2018-04-25 NOTE — Telephone Encounter (Signed)
Call in #90 with 5 rf 

## 2018-04-27 DIAGNOSIS — Z5189 Encounter for other specified aftercare: Secondary | ICD-10-CM | POA: Diagnosis not present

## 2018-05-04 DIAGNOSIS — Z85828 Personal history of other malignant neoplasm of skin: Secondary | ICD-10-CM | POA: Diagnosis not present

## 2018-05-04 DIAGNOSIS — L905 Scar conditions and fibrosis of skin: Secondary | ICD-10-CM | POA: Diagnosis not present

## 2018-05-04 DIAGNOSIS — L700 Acne vulgaris: Secondary | ICD-10-CM | POA: Diagnosis not present

## 2018-05-04 DIAGNOSIS — Z5189 Encounter for other specified aftercare: Secondary | ICD-10-CM | POA: Diagnosis not present

## 2018-05-04 DIAGNOSIS — D0362 Melanoma in situ of left upper limb, including shoulder: Secondary | ICD-10-CM | POA: Diagnosis not present

## 2018-05-04 DIAGNOSIS — L918 Other hypertrophic disorders of the skin: Secondary | ICD-10-CM | POA: Diagnosis not present

## 2018-05-04 DIAGNOSIS — L570 Actinic keratosis: Secondary | ICD-10-CM | POA: Diagnosis not present

## 2018-05-11 DIAGNOSIS — Z5189 Encounter for other specified aftercare: Secondary | ICD-10-CM | POA: Diagnosis not present

## 2018-05-12 DIAGNOSIS — D0362 Melanoma in situ of left upper limb, including shoulder: Secondary | ICD-10-CM | POA: Diagnosis not present

## 2018-05-18 DIAGNOSIS — Z5189 Encounter for other specified aftercare: Secondary | ICD-10-CM | POA: Diagnosis not present

## 2018-05-19 DIAGNOSIS — Z4802 Encounter for removal of sutures: Secondary | ICD-10-CM | POA: Diagnosis not present

## 2018-05-25 DIAGNOSIS — Z5189 Encounter for other specified aftercare: Secondary | ICD-10-CM | POA: Diagnosis not present

## 2018-06-01 DIAGNOSIS — Z5189 Encounter for other specified aftercare: Secondary | ICD-10-CM | POA: Diagnosis not present

## 2018-06-08 DIAGNOSIS — Z5189 Encounter for other specified aftercare: Secondary | ICD-10-CM | POA: Diagnosis not present

## 2018-06-15 DIAGNOSIS — Z5189 Encounter for other specified aftercare: Secondary | ICD-10-CM | POA: Diagnosis not present

## 2018-06-22 DIAGNOSIS — Z5189 Encounter for other specified aftercare: Secondary | ICD-10-CM | POA: Diagnosis not present

## 2018-06-22 DIAGNOSIS — Z23 Encounter for immunization: Secondary | ICD-10-CM | POA: Diagnosis not present

## 2018-06-29 DIAGNOSIS — Z5189 Encounter for other specified aftercare: Secondary | ICD-10-CM | POA: Diagnosis not present

## 2018-07-06 DIAGNOSIS — Z5189 Encounter for other specified aftercare: Secondary | ICD-10-CM | POA: Diagnosis not present

## 2018-07-14 DIAGNOSIS — Z5189 Encounter for other specified aftercare: Secondary | ICD-10-CM | POA: Diagnosis not present

## 2018-07-25 DIAGNOSIS — R499 Unspecified voice and resonance disorder: Secondary | ICD-10-CM | POA: Diagnosis not present

## 2018-07-25 DIAGNOSIS — R0602 Shortness of breath: Secondary | ICD-10-CM | POA: Diagnosis not present

## 2018-07-25 DIAGNOSIS — H1045 Other chronic allergic conjunctivitis: Secondary | ICD-10-CM | POA: Diagnosis not present

## 2018-07-25 DIAGNOSIS — J3089 Other allergic rhinitis: Secondary | ICD-10-CM | POA: Diagnosis not present

## 2018-07-27 DIAGNOSIS — R413 Other amnesia: Secondary | ICD-10-CM | POA: Diagnosis not present

## 2018-07-27 DIAGNOSIS — K219 Gastro-esophageal reflux disease without esophagitis: Secondary | ICD-10-CM | POA: Diagnosis not present

## 2018-07-27 DIAGNOSIS — J309 Allergic rhinitis, unspecified: Secondary | ICD-10-CM | POA: Diagnosis not present

## 2018-07-27 DIAGNOSIS — F329 Major depressive disorder, single episode, unspecified: Secondary | ICD-10-CM | POA: Diagnosis not present

## 2018-08-03 DIAGNOSIS — Z5189 Encounter for other specified aftercare: Secondary | ICD-10-CM | POA: Diagnosis not present

## 2018-08-10 DIAGNOSIS — Z0000 Encounter for general adult medical examination without abnormal findings: Secondary | ICD-10-CM | POA: Diagnosis not present

## 2018-08-10 DIAGNOSIS — R413 Other amnesia: Secondary | ICD-10-CM | POA: Diagnosis not present

## 2018-08-10 DIAGNOSIS — Z5189 Encounter for other specified aftercare: Secondary | ICD-10-CM | POA: Diagnosis not present

## 2018-08-17 DIAGNOSIS — Z5189 Encounter for other specified aftercare: Secondary | ICD-10-CM | POA: Diagnosis not present

## 2018-08-31 DIAGNOSIS — Z5189 Encounter for other specified aftercare: Secondary | ICD-10-CM | POA: Diagnosis not present

## 2018-09-15 DIAGNOSIS — T7840XA Allergy, unspecified, initial encounter: Secondary | ICD-10-CM | POA: Diagnosis not present

## 2018-09-23 DIAGNOSIS — Z5189 Encounter for other specified aftercare: Secondary | ICD-10-CM | POA: Diagnosis not present

## 2018-09-28 DIAGNOSIS — Z5189 Encounter for other specified aftercare: Secondary | ICD-10-CM | POA: Diagnosis not present

## 2018-10-05 DIAGNOSIS — Z5189 Encounter for other specified aftercare: Secondary | ICD-10-CM | POA: Diagnosis not present

## 2018-10-05 DIAGNOSIS — J069 Acute upper respiratory infection, unspecified: Secondary | ICD-10-CM | POA: Diagnosis not present

## 2018-10-12 DIAGNOSIS — Z5189 Encounter for other specified aftercare: Secondary | ICD-10-CM | POA: Diagnosis not present

## 2018-10-21 DIAGNOSIS — Z5189 Encounter for other specified aftercare: Secondary | ICD-10-CM | POA: Diagnosis not present

## 2018-10-25 ENCOUNTER — Other Ambulatory Visit: Payer: Self-pay | Admitting: Family Medicine

## 2018-10-25 NOTE — Telephone Encounter (Signed)
Dr. Fry please advise on refill. Thanks  

## 2018-10-26 DIAGNOSIS — Z5189 Encounter for other specified aftercare: Secondary | ICD-10-CM | POA: Diagnosis not present

## 2018-10-27 NOTE — Telephone Encounter (Signed)
Call in #[phone] with 5 rf for BOTH

## 2018-10-31 ENCOUNTER — Other Ambulatory Visit: Payer: Self-pay | Admitting: Family Medicine

## 2018-10-31 DIAGNOSIS — N3090 Cystitis, unspecified without hematuria: Secondary | ICD-10-CM | POA: Diagnosis not present

## 2018-10-31 DIAGNOSIS — R3989 Other symptoms and signs involving the genitourinary system: Secondary | ICD-10-CM | POA: Diagnosis not present

## 2018-10-31 NOTE — Telephone Encounter (Signed)
Copied from CRM [id number]. Topic: Quick Communication - Rx Refill/Question >> Oct 31, 2018  2:23 PM Gerrianne Scale wrote: Medication: ALPRAZolam Prudy Feeler) 0.5 MG tablet diazepam (VALIUM) 5 MG tablet  They faxed over request on the 4th 5th and yesterday   Has the patient contacted their pharmacy? Yes.   (Agent: If no, request that the patient contact the pharmacy for the refill.) (Agent: If yes, when and what did the pharmacy advise?)pharmacy called they tried to reach out three times  Preferred Pharmacy (with phone number or street name):   [facility] - [place], [place] - [address] [phone] (Phone) [phone] (Fax)    Agent: Please be advised that RX refills may take up to 3 business days. We ask that you follow-up with your pharmacy.

## 2018-10-31 NOTE — Telephone Encounter (Signed)
Both refills have been sent to the pharmacy and left on the VM for refills.

## 2018-10-31 NOTE — Telephone Encounter (Signed)
Refill for controlled medications. Pt has an appointment scheduled for 11/04/2018.  Dr. Clent Ridges. LOV  01/10/18

## 2018-11-02 ENCOUNTER — Ambulatory Visit: Payer: BLUE CROSS/BLUE SHIELD | Admitting: Family Medicine

## 2018-11-02 ENCOUNTER — Encounter: Payer: Self-pay | Admitting: Family Medicine

## 2018-11-02 VITALS — BP 110/70 | HR 79 | Temp 98.4°F | Ht 64.0 in | Wt 107.8 lb

## 2018-11-02 DIAGNOSIS — F900 Attention-deficit hyperactivity disorder, predominantly inattentive type: Secondary | ICD-10-CM | POA: Diagnosis not present

## 2018-11-02 DIAGNOSIS — F418 Other specified anxiety disorders: Secondary | ICD-10-CM | POA: Diagnosis not present

## 2018-11-02 DIAGNOSIS — Z5189 Encounter for other specified aftercare: Secondary | ICD-10-CM | POA: Diagnosis not present

## 2018-11-02 MED ORDER — DULOXETINE HCL 60 MG PO CPEP: 60.0000 mg | ORAL_CAPSULE | Freq: Every day | ORAL | 3 refills | Status: DC

## 2018-11-02 MED ORDER — LAMOTRIGINE 100 MG PO TABS: 50.0000 mg | ORAL_TABLET | Freq: Every day | ORAL | 0 refills | Status: DC

## 2018-11-02 MED ORDER — AMPHETAMINE-DEXTROAMPHET ER 10 MG PO CP24: 10.0000 mg | ORAL_CAPSULE | Freq: Every day | ORAL | 0 refills | Status: DC | PRN

## 2018-11-02 NOTE — Progress Notes (Signed)
   Subjective:    Patient ID: Sierra Peters, female    DOB: 1961-04-29, 58 y.o.   MRN: [id number]  HPI Here to discuss her moods and her trouble with focus. 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 This has been very stressful for her and it has involved a lot of studying for a big certification exam she has to take in 2 weeks. She has always had trouble focusing in tasks, even in school. She has difficulty finishing tasks without getting distracted. In addition to the job stresses, she has been through a divorce and she had to sell her house in the past year. She has been very anxious and depressed, sleep is difficult and her appetite is poor. She has been taking Lexapro and Lamictal for the past 2 years, ands they no longer seem to help her mch. No suicidal ideation. She says she needs to be around for her beloved granddaughter. She has been seeing Sharrie Rothman, a therapist with [facility], for therapy about twice a month.    Review of Systems  Constitutional: Negative.   Respiratory: Negative.   Cardiovascular: Negative.   Neurological: Negative.   Psychiatric/Behavioral: Positive for decreased concentration, dysphoric mood and sleep disturbance. Negative for agitation, behavioral problems, confusion, hallucinations, self-injury and suicidal ideas. The patient is nervous/anxious.        Objective:   Physical Exam Constitutional:      Appearance: Normal appearance.  Cardiovascular:     Rate and Rhythm: Normal rate and regular rhythm.     Pulses: Normal pulses.     Heart sounds: Normal heart sounds.  Pulmonary:     Effort: Pulmonary effort is normal.     Breath sounds: Normal breath sounds.  Neurological:     General: No focal deficit present.     Mental Status: She is alert and oriented to person, place, and time.  Psychiatric:          Mood and Affect: Mood normal.        Behavior: Behavior normal.        Thought Content: Thought content normal.        Judgment: Judgment normal.           Assessment & Plan:  She is dealing with depression and anxiety, so we will stop Lexapro and start her on Cymbalta 60 mg daily. We will decrease the Lamictal to 50 mg daily and we plan to wean off this in the next few weeks. She also has ADHD and we will treat this with Adderall XR 10 mg each morning as needed. Recheck in 3 weeks to adjust medications. Once we get her off Lamictal, we may start her on Latuda as well if needed. We spent 45 minutes together discussing these problems.  Gershon Crane, MD

## 2018-11-03 ENCOUNTER — Telehealth: Payer: Self-pay | Admitting: Family Medicine

## 2018-11-03 DIAGNOSIS — F418 Other specified anxiety disorders: Secondary | ICD-10-CM | POA: Insufficient documentation

## 2018-11-03 DIAGNOSIS — F900 Attention-deficit hyperactivity disorder, predominantly inattentive type: Secondary | ICD-10-CM | POA: Insufficient documentation

## 2018-11-03 NOTE — Telephone Encounter (Signed)
Copied from CRM [id number]. Topic: Quick Communication - See Telephone Encounter >> Nov 03, 2018  1:34 PM Windy Kalata, NT wrote: CRM for notification. See Telephone encounter for: 11/03/18.  Patient is calling and would like to know if she can change amphetamine-dextroamphetamine (ADDERALL XR) 10 MG 24 hr capsule to 5mg  as she took it this morning with food and she states she is very jittery and can not sit still at all.

## 2018-11-03 NOTE — Telephone Encounter (Signed)
Dr. Fry please advise. Thanks  

## 2018-11-04 MED ORDER — AMPHETAMINE-DEXTROAMPHET ER 5 MG PO CP24: 5.0000 mg | ORAL_CAPSULE | Freq: Every day | ORAL | 0 refills | Status: DC

## 2018-11-04 NOTE — Telephone Encounter (Signed)
I sent in #30 of the XR 5 mg

## 2018-11-04 NOTE — Telephone Encounter (Signed)
Called and spoke with pt and she is aware of rx that has been sent to the pharmacy per Dr. Clent Ridges.

## 2018-11-16 DIAGNOSIS — Z5189 Encounter for other specified aftercare: Secondary | ICD-10-CM | POA: Diagnosis not present

## 2018-11-21 DIAGNOSIS — Z1151 Encounter for screening for human papillomavirus (HPV): Secondary | ICD-10-CM | POA: Diagnosis not present

## 2018-11-21 DIAGNOSIS — Z681 Body mass index (BMI) 19 or less, adult: Secondary | ICD-10-CM | POA: Diagnosis not present

## 2018-11-21 DIAGNOSIS — Z01419 Encounter for gynecological examination (general) (routine) without abnormal findings: Secondary | ICD-10-CM | POA: Diagnosis not present

## 2018-11-21 DIAGNOSIS — Z780 Asymptomatic menopausal state: Secondary | ICD-10-CM | POA: Diagnosis not present

## 2018-11-21 DIAGNOSIS — Z124 Encounter for screening for malignant neoplasm of cervix: Secondary | ICD-10-CM | POA: Diagnosis not present

## 2018-11-21 DIAGNOSIS — B977 Papillomavirus as the cause of diseases classified elsewhere: Secondary | ICD-10-CM | POA: Diagnosis not present

## 2018-11-21 DIAGNOSIS — Z113 Encounter for screening for infections with a predominantly sexual mode of transmission: Secondary | ICD-10-CM | POA: Diagnosis not present

## 2018-11-22 DIAGNOSIS — L570 Actinic keratosis: Secondary | ICD-10-CM | POA: Diagnosis not present

## 2018-11-22 DIAGNOSIS — D485 Neoplasm of uncertain behavior of skin: Secondary | ICD-10-CM | POA: Diagnosis not present

## 2018-11-22 DIAGNOSIS — L820 Inflamed seborrheic keratosis: Secondary | ICD-10-CM | POA: Diagnosis not present

## 2018-11-22 DIAGNOSIS — D2261 Melanocytic nevi of right upper limb, including shoulder: Secondary | ICD-10-CM | POA: Diagnosis not present

## 2018-11-22 DIAGNOSIS — D2262 Melanocytic nevi of left upper limb, including shoulder: Secondary | ICD-10-CM | POA: Diagnosis not present

## 2018-11-22 DIAGNOSIS — Z85828 Personal history of other malignant neoplasm of skin: Secondary | ICD-10-CM | POA: Diagnosis not present

## 2018-11-22 DIAGNOSIS — Z85820 Personal history of malignant melanoma of skin: Secondary | ICD-10-CM | POA: Diagnosis not present

## 2018-11-22 DIAGNOSIS — D0471 Carcinoma in situ of skin of right lower limb, including hip: Secondary | ICD-10-CM | POA: Diagnosis not present

## 2018-11-22 DIAGNOSIS — C44519 Basal cell carcinoma of skin of other part of trunk: Secondary | ICD-10-CM | POA: Diagnosis not present

## 2018-11-23 ENCOUNTER — Ambulatory Visit: Payer: Self-pay | Admitting: Family Medicine

## 2018-11-23 DIAGNOSIS — Z5189 Encounter for other specified aftercare: Secondary | ICD-10-CM | POA: Diagnosis not present

## 2018-11-30 DIAGNOSIS — F329 Major depressive disorder, single episode, unspecified: Secondary | ICD-10-CM | POA: Diagnosis not present

## 2018-11-30 DIAGNOSIS — M19041 Primary osteoarthritis, right hand: Secondary | ICD-10-CM | POA: Diagnosis not present

## 2018-11-30 DIAGNOSIS — Z5189 Encounter for other specified aftercare: Secondary | ICD-10-CM | POA: Diagnosis not present

## 2018-11-30 DIAGNOSIS — J309 Allergic rhinitis, unspecified: Secondary | ICD-10-CM | POA: Diagnosis not present

## 2018-11-30 DIAGNOSIS — K219 Gastro-esophageal reflux disease without esophagitis: Secondary | ICD-10-CM | POA: Diagnosis not present

## 2018-12-01 ENCOUNTER — Other Ambulatory Visit: Payer: Self-pay | Admitting: Plastic Surgery

## 2018-12-01 DIAGNOSIS — J3089 Other allergic rhinitis: Secondary | ICD-10-CM | POA: Diagnosis not present

## 2018-12-01 DIAGNOSIS — T8543XA Leakage of breast prosthesis and implant, initial encounter: Secondary | ICD-10-CM

## 2018-12-07 DIAGNOSIS — Z5189 Encounter for other specified aftercare: Secondary | ICD-10-CM | POA: Diagnosis not present

## 2018-12-09 ENCOUNTER — Other Ambulatory Visit: Payer: Self-pay | Admitting: Family Medicine

## 2018-12-12 NOTE — Telephone Encounter (Signed)
Dr. Fry please advise on refill of zolpidem.  Thanks  

## 2018-12-12 NOTE — Telephone Encounter (Signed)
Call in #30 with 5 rf 

## 2018-12-12 NOTE — Telephone Encounter (Signed)
Refill called to the pharmacy and left on the VM 

## 2018-12-14 DIAGNOSIS — Z5189 Encounter for other specified aftercare: Secondary | ICD-10-CM | POA: Diagnosis not present

## 2018-12-19 ENCOUNTER — Other Ambulatory Visit: Payer: Self-pay | Admitting: Plastic Surgery

## 2018-12-19 ENCOUNTER — Ambulatory Visit: Payer: Self-pay

## 2018-12-19 ENCOUNTER — Ambulatory Visit
Admission: RE | Admit: 2018-12-19 | Discharge: 2018-12-19 | Disposition: A | Discharge status: Home / Self Care | Payer: BLUE CROSS/BLUE SHIELD | Source: Ambulatory Visit | Attending: Plastic Surgery | Admitting: Plastic Surgery

## 2018-12-19 ENCOUNTER — Other Ambulatory Visit: Payer: Self-pay

## 2018-12-19 DIAGNOSIS — Z9882 Breast implant status: Secondary | ICD-10-CM | POA: Diagnosis not present

## 2018-12-19 DIAGNOSIS — T8543XA Leakage of breast prosthesis and implant, initial encounter: Secondary | ICD-10-CM

## 2018-12-28 DIAGNOSIS — Z5189 Encounter for other specified aftercare: Secondary | ICD-10-CM | POA: Diagnosis not present

## 2019-01-13 DIAGNOSIS — Z5189 Encounter for other specified aftercare: Secondary | ICD-10-CM | POA: Diagnosis not present

## 2019-01-31 DIAGNOSIS — Z5189 Encounter for other specified aftercare: Secondary | ICD-10-CM | POA: Diagnosis not present

## 2019-02-27 DIAGNOSIS — J309 Allergic rhinitis, unspecified: Secondary | ICD-10-CM | POA: Diagnosis not present

## 2019-02-27 DIAGNOSIS — K219 Gastro-esophageal reflux disease without esophagitis: Secondary | ICD-10-CM | POA: Diagnosis not present

## 2019-02-27 DIAGNOSIS — F329 Major depressive disorder, single episode, unspecified: Secondary | ICD-10-CM | POA: Diagnosis not present

## 2019-02-27 DIAGNOSIS — M19041 Primary osteoarthritis, right hand: Secondary | ICD-10-CM | POA: Diagnosis not present

## 2019-02-28 DIAGNOSIS — Z5189 Encounter for other specified aftercare: Secondary | ICD-10-CM | POA: Diagnosis not present

## 2019-05-10 ENCOUNTER — Telehealth: Payer: Self-pay

## 2019-05-10 MED ORDER — ZOLPIDEM TARTRATE 10 MG PO TABS: 15.0000 mg | ORAL_TABLET | Freq: Every evening | ORAL | 5 refills | Status: DC | PRN

## 2019-05-10 NOTE — Telephone Encounter (Signed)
I sent in a new rx for her to take 1.5 tabs a night

## 2019-05-10 NOTE — Telephone Encounter (Signed)
Copied from [place] [id number]. Topic: General - Inquiry >> May 10, 2019  9:18 AM Richardo Priest, NT wrote: Reason for CRM: Patient called in stating she is wanting a higher dosage of her zolpidem (AMBIEN) 10 MG tablet. States she cannot sleep for more than 4 hours with the type she has now, however she tried a pill and a half and can sleep through the night. Please advise.

## 2019-05-10 NOTE — Telephone Encounter (Signed)
Patient is aware. Nothing further needed.  

## 2019-05-17 ENCOUNTER — Other Ambulatory Visit: Payer: Self-pay | Admitting: Family Medicine

## 2019-05-18 NOTE — Telephone Encounter (Signed)
Last fill 10/31/2018 Last OV 11/02/2018  Ok to fill?

## 2019-05-23 DIAGNOSIS — M19041 Primary osteoarthritis, right hand: Secondary | ICD-10-CM | POA: Diagnosis not present

## 2019-05-23 DIAGNOSIS — J309 Allergic rhinitis, unspecified: Secondary | ICD-10-CM | POA: Diagnosis not present

## 2019-05-23 DIAGNOSIS — K219 Gastro-esophageal reflux disease without esophagitis: Secondary | ICD-10-CM | POA: Diagnosis not present

## 2019-05-23 DIAGNOSIS — F329 Major depressive disorder, single episode, unspecified: Secondary | ICD-10-CM | POA: Diagnosis not present

## 2019-07-05 ENCOUNTER — Ambulatory Visit: Payer: BC Managed Care – PPO | Admitting: Family Medicine

## 2019-07-05 ENCOUNTER — Encounter: Payer: Self-pay | Admitting: Family Medicine

## 2019-07-05 ENCOUNTER — Other Ambulatory Visit: Payer: Self-pay

## 2019-07-05 VITALS — BP 106/70 | HR 89 | Temp 98.0°F | Ht 64.0 in | Wt 104.0 lb

## 2019-07-05 DIAGNOSIS — Z23 Encounter for immunization: Secondary | ICD-10-CM

## 2019-07-05 DIAGNOSIS — R1030 Lower abdominal pain, unspecified: Secondary | ICD-10-CM

## 2019-07-05 MED ORDER — DICYCLOMINE HCL 20 MG PO TABS: 20.0000 mg | ORAL_TABLET | Freq: Four times a day (QID) | ORAL | 1 refills | Status: DC | PRN

## 2019-07-05 NOTE — Progress Notes (Signed)
   Subjective:    Patient ID: Sierra Peters, female    DOB: Mar 20, 1961, 58 y.o.   MRN: [id number]  HPI Here for 3 months of a recurrent pattern of lower abdominal cramps with diarrhea. These episodes occur about once a week and they last 24 hours. No blood in the stools. The pain makes her feel nauseated but she has not vomited. No fevers. No weight changes. No urinary symptoms. She cannot identify anything in her diet that could be causing these, and she has tried to eliminate foods one by one. No change in medications. No known tick bites. Her colonoscopy in 2014 showed only some sigmoid diverticula.    Review of Systems  Constitutional: Negative.   Respiratory: Negative.   Cardiovascular: Negative.   Gastrointestinal: Positive for abdominal pain, diarrhea and nausea. Negative for abdominal distention, anal bleeding, blood in stool, constipation, rectal pain and vomiting.  Genitourinary: Negative.        Objective:   Physical Exam Constitutional:      Appearance: Normal appearance. She is well-developed.  Cardiovascular:     Rate and Rhythm: Normal rate and regular rhythm.     Pulses: Normal pulses.     Heart sounds: Normal heart sounds.  Pulmonary:     Effort: Pulmonary effort is normal.     Breath sounds: Normal breath sounds.  Abdominal:     General: Abdomen is flat. Bowel sounds are normal. There is no distension.     Palpations: Abdomen is soft. There is no mass.     Tenderness: There is no abdominal tenderness. There is no guarding or rebound.     Hernia: No hernia is present.  Neurological:     Mental Status: She is alert.           Assessment & Plan:  Recurrent lower abdominal pains. These may be due to a type of colitis. She can use Dicyclomine as needed for symptomatic relief. Refer to GI to evaluate further.  Alysia Penna, MD

## 2019-08-11 DIAGNOSIS — F329 Major depressive disorder, single episode, unspecified: Secondary | ICD-10-CM | POA: Diagnosis not present

## 2019-08-11 DIAGNOSIS — M19041 Primary osteoarthritis, right hand: Secondary | ICD-10-CM | POA: Diagnosis not present

## 2019-08-11 DIAGNOSIS — J309 Allergic rhinitis, unspecified: Secondary | ICD-10-CM | POA: Diagnosis not present

## 2019-08-11 DIAGNOSIS — Z760 Encounter for issue of repeat prescription: Secondary | ICD-10-CM | POA: Diagnosis not present

## 2019-09-28 DIAGNOSIS — Z85828 Personal history of other malignant neoplasm of skin: Secondary | ICD-10-CM | POA: Diagnosis not present

## 2019-09-28 DIAGNOSIS — D045 Carcinoma in situ of skin of trunk: Secondary | ICD-10-CM | POA: Diagnosis not present

## 2019-09-28 DIAGNOSIS — L570 Actinic keratosis: Secondary | ICD-10-CM | POA: Diagnosis not present

## 2019-09-28 DIAGNOSIS — D0461 Carcinoma in situ of skin of right upper limb, including shoulder: Secondary | ICD-10-CM | POA: Diagnosis not present

## 2019-09-28 DIAGNOSIS — C44729 Squamous cell carcinoma of skin of left lower limb, including hip: Secondary | ICD-10-CM | POA: Diagnosis not present

## 2019-09-28 DIAGNOSIS — Z85820 Personal history of malignant melanoma of skin: Secondary | ICD-10-CM | POA: Diagnosis not present

## 2019-10-24 ENCOUNTER — Other Ambulatory Visit: Payer: Self-pay

## 2019-10-24 ENCOUNTER — Telehealth (INDEPENDENT_AMBULATORY_CARE_PROVIDER_SITE_OTHER): Payer: BC Managed Care – PPO | Admitting: Family Medicine

## 2019-10-24 DIAGNOSIS — F5101 Primary insomnia: Secondary | ICD-10-CM

## 2019-10-24 MED ORDER — TRAZODONE HCL 100 MG PO TABS: 100.0000 mg | ORAL_TABLET | Freq: Every day | ORAL | 2 refills | Status: DC

## 2019-10-24 NOTE — Progress Notes (Signed)
Virtual Visit via Telephone Note  I connected with the patient on 10/24/19 at  1:30 PM EST by telephone and verified that I am speaking with the correct person using two identifiers.   I discussed the limitations, risks, security and privacy concerns of performing an evaluation and management service by telephone and the availability of in person appointments. I also discussed with the patient that there may be a patient responsible charge related to this service. The patient expressed understanding and agreed to proceed.  Location patient: home Location provider: work or home office Participants present for the call: patient, provider Patient did not have a visit in the prior 7 days to address this/these issue(s).   History of Present Illness: Here asking for advice about getting off Ambien. She has been on this for several years and ot does help her sleep. However she has become alarmed that she may be dependent on this, and her partner says she "acts weird" every night before she falls asleep.   Observations/Objective: Patient sounds cheerful and well on the phone. I do not appreciate any SOB. Speech and thought processing are grossly intact. Patient reported vitals:  Assessment and Plan: Insomnia, we will taper her off Ambien and at the same time introduce a new non-habit forming sleep aid. For the next week she will take 10 mg of Ambien at bedtime by itself. Then the next week she will take 5 mg of Ambien and she will also start on Trazodone 100 mg qhs. Then after that she will stop all Ambien and continue on the Trazodone alone. We will have a follow up visit in 3 weeks.  Gershon Crane, MD   Follow Up Instructions:     [phone] 5-10 [phone] 11-20 [id number] 21-30 I did not refer this patient for an OV in the next 24 hours for this/these issue(s).  I discussed the assessment and treatment plan with the patient. The patient was provided an opportunity to ask questions and all were  answered. The patient agreed with the plan and demonstrated an understanding of the instructions.   The patient was advised to call back or seek an in-person evaluation if the symptoms worsen or if the condition fails to improve as anticipated.  I provided 14 minutes of non-face-to-face time during this encounter.   Gershon Crane, MD

## 2019-10-26 DIAGNOSIS — R1032 Left lower quadrant pain: Secondary | ICD-10-CM | POA: Diagnosis not present

## 2019-10-26 DIAGNOSIS — K529 Noninfective gastroenteritis and colitis, unspecified: Secondary | ICD-10-CM | POA: Diagnosis not present

## 2019-11-21 ENCOUNTER — Other Ambulatory Visit: Payer: Self-pay | Admitting: Family Medicine

## 2019-11-21 DIAGNOSIS — Z85828 Personal history of other malignant neoplasm of skin: Secondary | ICD-10-CM | POA: Diagnosis not present

## 2019-11-21 DIAGNOSIS — D0471 Carcinoma in situ of skin of right lower limb, including hip: Secondary | ICD-10-CM | POA: Diagnosis not present

## 2019-11-21 DIAGNOSIS — L821 Other seborrheic keratosis: Secondary | ICD-10-CM | POA: Diagnosis not present

## 2019-11-21 NOTE — Telephone Encounter (Signed)
Last filled 05/18/2019 Zolpidem is not of med list  Last OV 07/05/2019

## 2019-11-30 DIAGNOSIS — Z01419 Encounter for gynecological examination (general) (routine) without abnormal findings: Secondary | ICD-10-CM | POA: Diagnosis not present

## 2019-11-30 DIAGNOSIS — B977 Papillomavirus as the cause of diseases classified elsewhere: Secondary | ICD-10-CM | POA: Diagnosis not present

## 2019-11-30 DIAGNOSIS — Z79890 Hormone replacement therapy (postmenopausal): Secondary | ICD-10-CM | POA: Diagnosis not present

## 2019-11-30 DIAGNOSIS — Z681 Body mass index (BMI) 19 or less, adult: Secondary | ICD-10-CM | POA: Diagnosis not present

## 2019-12-09 DIAGNOSIS — H16143 Punctate keratitis, bilateral: Secondary | ICD-10-CM | POA: Diagnosis not present

## 2019-12-09 DIAGNOSIS — H04123 Dry eye syndrome of bilateral lacrimal glands: Secondary | ICD-10-CM | POA: Diagnosis not present

## 2019-12-26 IMAGING — MR MR CERVICAL SPINE W/O CM
5 series · 31 of 48 positions shown · non-contrast
Comparison: None.

CLINICAL DATA: Worsening headache, neck pain

EXAM:
MRI CERVICAL SPINE WITHOUT CONTRAST
TECHNIQUE: Multiplanar, multisequence MR imaging of the cervical spine was
performed. No intravenous contrast was administered.

[Series 3: T2 · sagittal · 3.3mm · 0.41mm/px · 6 of 12 slices shown (1 of 2)]
[im 1/12]
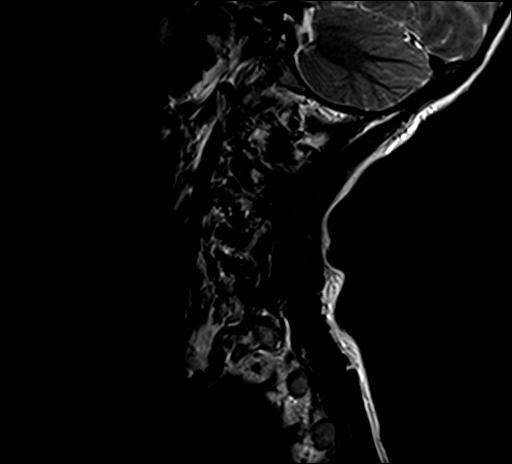
[im 3/12]
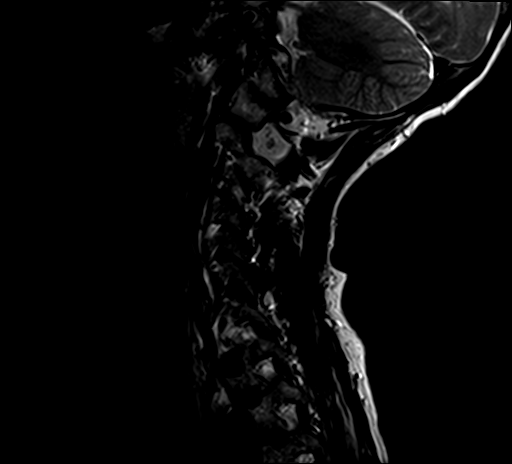
[im 5/12]
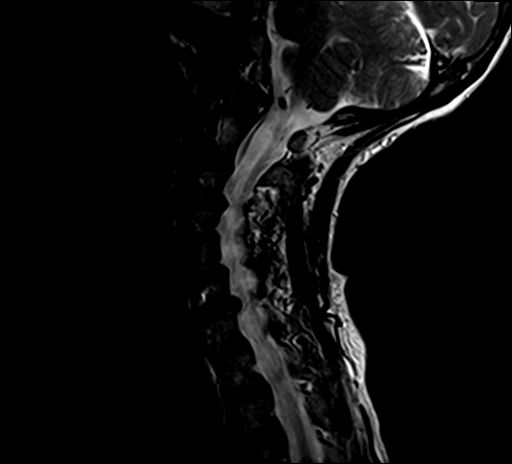
[im 7/12]
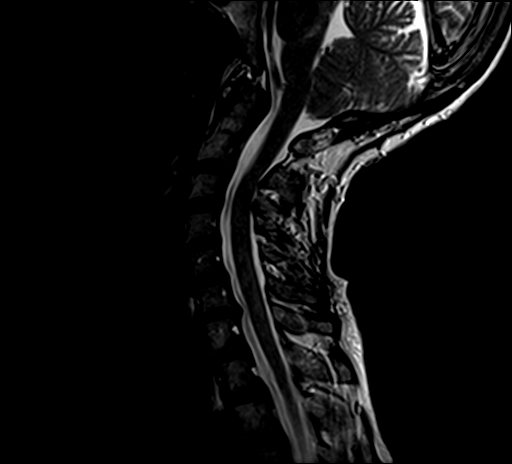
[im 9/12]
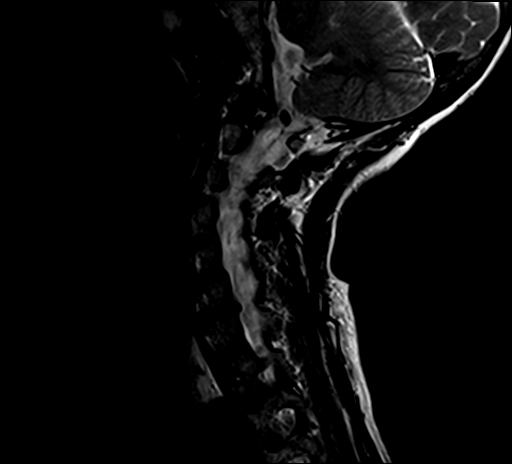
[im 12/12]
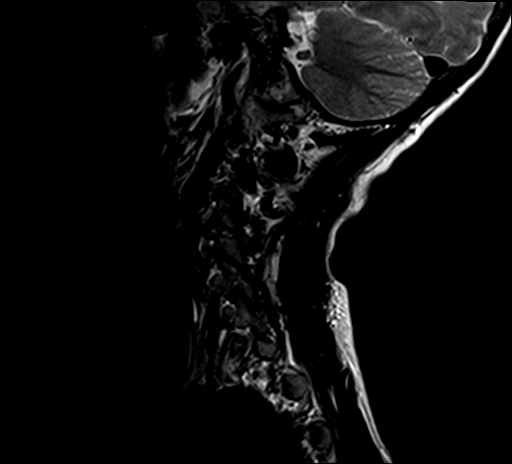

[Series 4: T1 · sagittal · 3.3mm · 0.41mm/px · 7 of 12 slices shown]
[im 1/12]
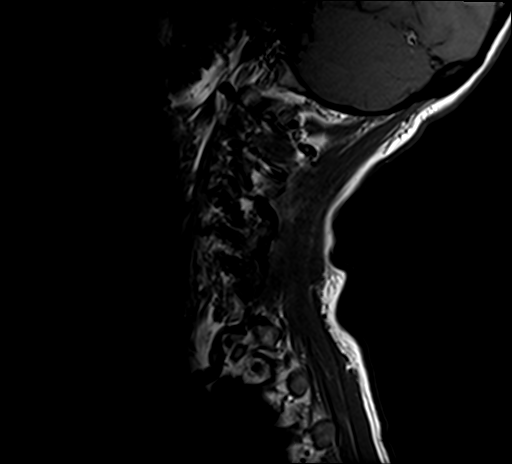
[im 2/12]
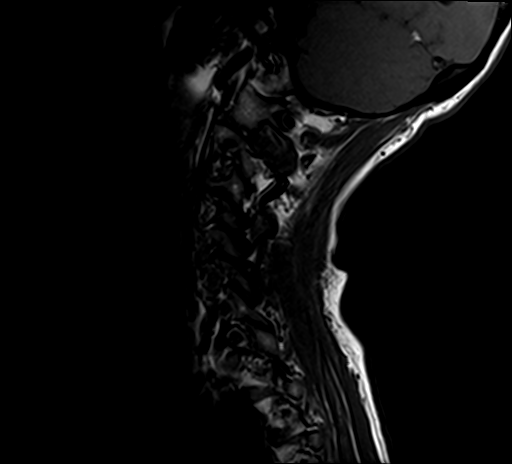
[im 4/12]
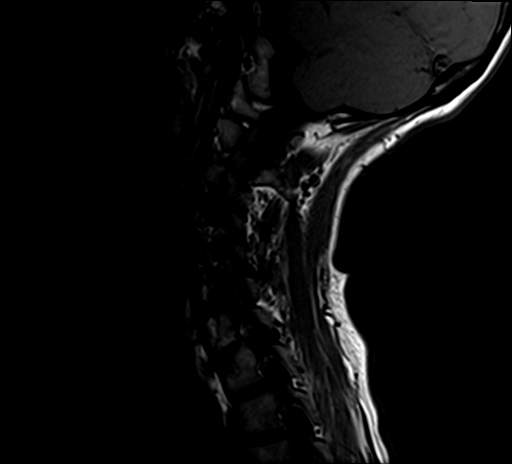
[im 6/12]
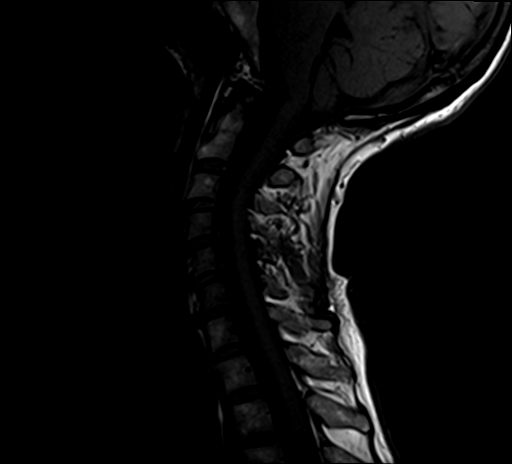
[im 8/12]
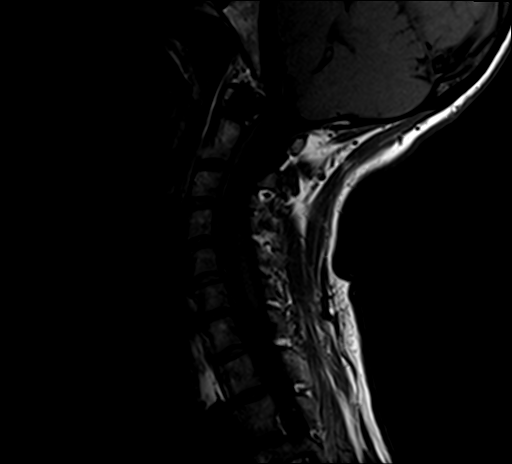
[im 10/12]
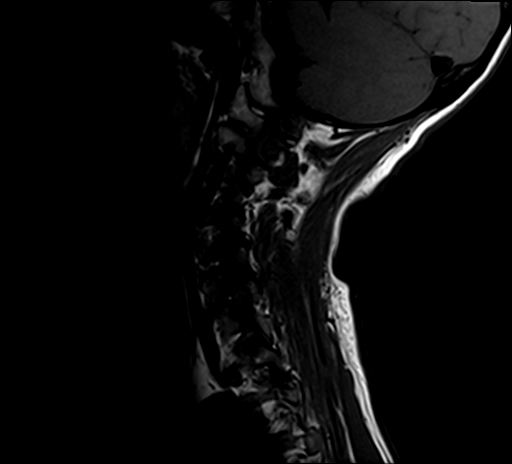
[im 12/12]
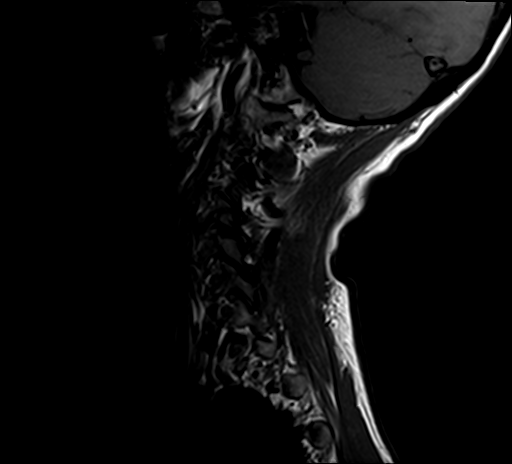

[Series 5: STIR · sagittal · 3.3mm · 0.82mm/px · 7 of 12 slices shown]
[im 1/12]
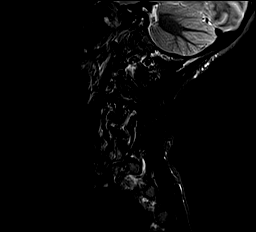
[im 2/12]
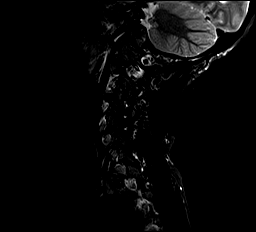
[im 4/12]
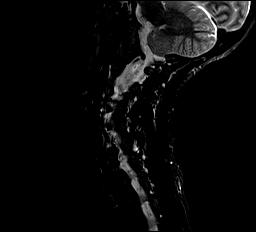
[im 6/12]
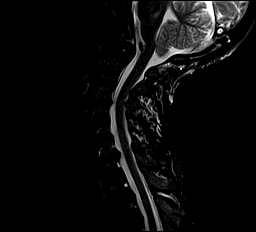
[im 8/12]
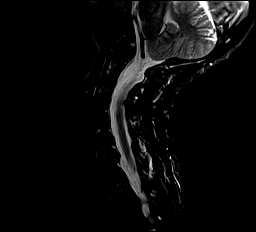
[im 10/12]
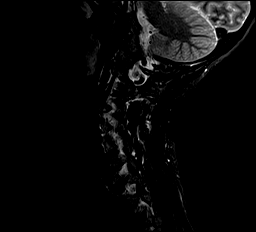
[im 12/12]
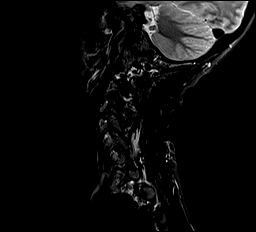

[Series 6: GRE · axial · 3.0mm · 0.35mm/px · z∈[-108,-84]mm · 3 of 24 slices shown]
[im 1/24]
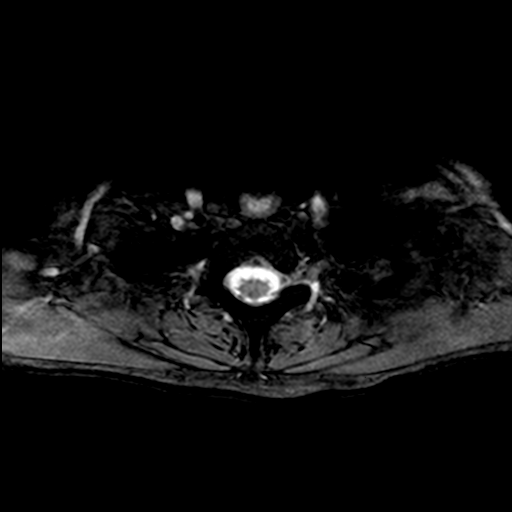
[im 4/24]
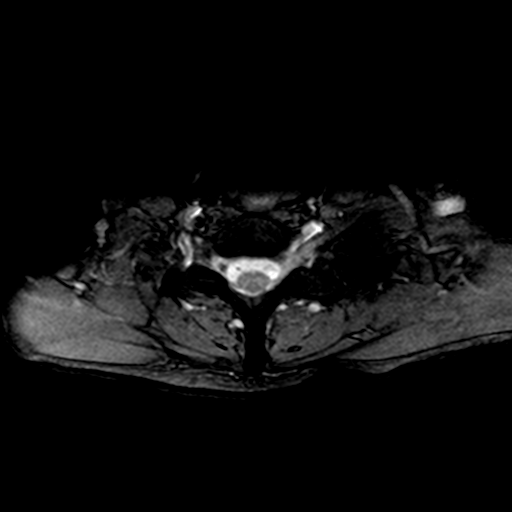
[im 8/24]
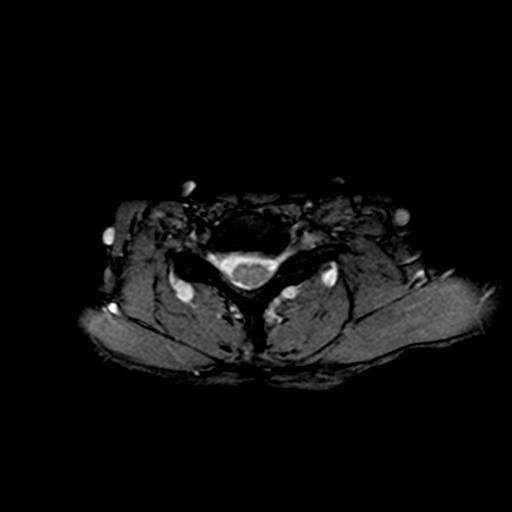

[Series 7: T2 · axial · 3.0mm · 0.70mm/px · z∈[-108,-28]mm · 8 of 24 slices shown (2 of 2)]
[im 1/24]
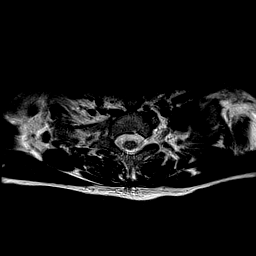
[im 4/24]
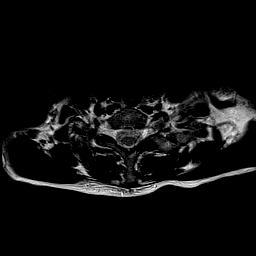
[im 8/24]
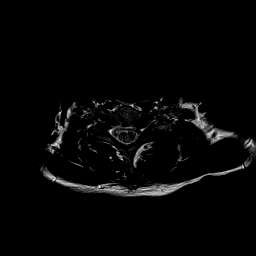
[im 11/24]
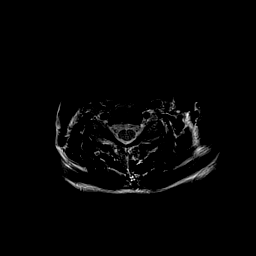
[im 13/24]
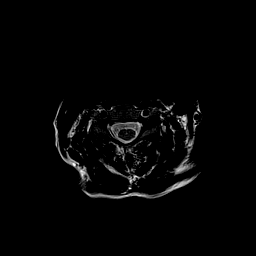
[im 16/24]
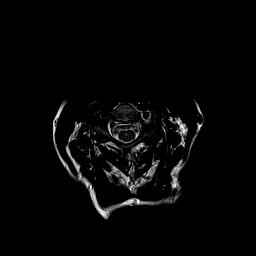
[im 20/24]
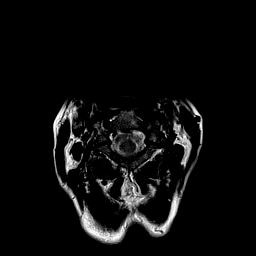
[im 24/24]
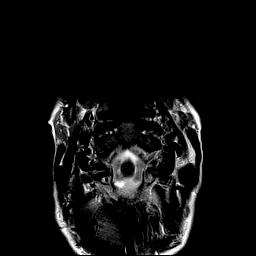

[31 of 48 positions shown; findings below may reference images not displayed]

FINDINGS: Alignment: Physiologic.

Vertebrae: No fracture, evidence of discitis, or bone lesion.

Cord: Normal signal and morphology.

Posterior Fossa, vertebral arteries, paraspinal tissues: Posterior
fossa demonstrates no focal abnormality. Vertebral artery flow voids
are maintained. Paraspinal soft tissues are unremarkable.

Disc levels:

Discs: Mild degenerative disc disease with disc height loss at C6-7.

C2-3: No significant disc bulge. No neural foraminal stenosis. No
central canal stenosis.

C3-4: No significant disc bulge. Mild left facet arthropathy. Left
uncovertebral degenerative changes and left foraminal narrowing. No
central canal stenosis.

C4-5: No significant disc bulge. No neural foraminal stenosis. No
central canal stenosis.

C5-6: Minimal broad-based disc bulge. No neural foraminal stenosis.
No central canal stenosis.

C6-7: Broad-based disc bulge flattening the ventral thecal sac. Mild
foraminal stenosis. No right foraminal stenosis. No central canal
stenosis.

C7-T1: No significant disc bulge. No neural foraminal stenosis. No
central canal stenosis.

T1-2 and T2-3: Tiny central disc protrusion.
IMPRESSION: 1. At C6-7 there is a broad-based disc bulge flattening the ventral
thecal sac. Mild foraminal stenosis. No right foraminal stenosis.
2. At C3-4 there is a mild left facet arthropathy. Left
uncovertebral degenerative changes and left foraminal narrowing.

## 2020-04-24 DIAGNOSIS — M67441 Ganglion, right hand: Secondary | ICD-10-CM | POA: Diagnosis not present

## 2020-04-24 DIAGNOSIS — M79644 Pain in right finger(s): Secondary | ICD-10-CM | POA: Diagnosis not present

## 2020-04-25 ENCOUNTER — Telehealth (INDEPENDENT_AMBULATORY_CARE_PROVIDER_SITE_OTHER): Payer: BC Managed Care – PPO | Admitting: Family Medicine

## 2020-04-25 ENCOUNTER — Encounter: Payer: Self-pay | Admitting: Family Medicine

## 2020-04-25 DIAGNOSIS — G44221 Chronic tension-type headache, intractable: Secondary | ICD-10-CM | POA: Diagnosis not present

## 2020-04-25 MED ORDER — CYCLOBENZAPRINE HCL 10 MG PO TABS: 10.0000 mg | ORAL_TABLET | Freq: Three times a day (TID) | ORAL | 2 refills | Status: DC | PRN

## 2020-04-25 MED ORDER — BUTALBITAL-APAP-CAFFEINE 50-325-40 MG PO TABS: 1.0000 | ORAL_TABLET | ORAL | 2 refills | Status: DC | PRN

## 2020-04-25 NOTE — Progress Notes (Signed)
Subjective:    Patient ID: Sierra Peters, female    DOB: 10/27/1960, 59 y.o.   MRN: [id number]  HPI Virtual Visit via Video Note  I connected with the patient on 04/25/20 at 10:00 AM EDT by a video enabled telemedicine application and verified that I am speaking with the correct person using two identifiers.  Location patient: home Location provider:work or home office Persons participating in the virtual visit: patient, provider  I discussed the limitations of evaluation and management by telemedicine and the availability of in person appointments. The patient expressed understanding and agreed to proceed.   HPI: Here for tension headaches. She has been under a lot of stress from her job and she spends hours a day on her computer. She often has tightness and pain in the neck, shoulders, and upper back. These often lead to a dull headache which covers the entire head. Now for the past 2 weeks she has had an unrelenting dull headache over the entire head which does not respond to Meloxicam, Tylenol, or Imitrex. No nausea, no blurred vision, no other neurologic deficits.    ROS: See pertinent positives and negatives per HPI.  Past Medical History:  Diagnosis Date  . Allergy    sees Dr. Corinda Gubler  . Anxiety   . Chronic fatigue   . Depression   . Headache(784.0)   . MVP (mitral valve prolapse)     Past Surgical History:  Procedure Laterality Date  . APPENDECTOMY    . AUGMENTATION MAMMAPLASTY Bilateral 09/2017   silicone   . BREAST SURGERY     augmentation per Dr. Stephens November  . COLONOSCOPY  02/07/2013   per Dr. Ewing Schlein, sigmoid diverticula, no polyps, repeat in 10 yrs   . ESOPHAGOGASTRODUODENOSCOPY  02/07/2013   per Dr. Ewing Schlein, esophageal erosions, hiatal hernia   . TUBAL LIGATION      Family History  Problem Relation Age of Onset  . Arthritis Other   . Heart disease Other        cardiovascular disorder     Current Outpatient Medications:  .  ALPRAZolam (XANAX) 0.5 MG  tablet, TAKE 1 TABLET 3 TIMES A DAY AS NEEDED., Disp: 90 tablet, Rfl: 5 .  butalbital-acetaminophen-caffeine (FIORICET) 50-325-40 MG tablet, Take 1 tablet by mouth every 4 (four) hours as needed (for tension headaches)., Disp: 60 tablet, Rfl: 2 .  cyclobenzaprine (FLEXERIL) 10 MG tablet, Take 1 tablet (10 mg total) by mouth 3 (three) times daily as needed for muscle spasms., Disp: 60 tablet, Rfl: 2 .  desloratadine-pseudoephedrine (CLARINEX-D 24-HOUR) 5-240 MG per 24 hr tablet, Take 1 tablet by mouth daily. , Disp: , Rfl:  .  Dexlansoprazole (DEXILANT PO), Take by mouth daily., Disp: , Rfl:  .  dicyclomine (BENTYL) 20 MG tablet, Take 1 tablet (20 mg total) by mouth every 6 (six) hours as needed (abdominal cramps)., Disp: 60 tablet, Rfl: 1 .  DULoxetine (CYMBALTA) 60 MG capsule, Take 1 capsule (60 mg total) by mouth daily., Disp: 30 capsule, Rfl: 3 .  estradiol (VIVELLE-DOT) 0.05 MG/24HR patch, Place 1 patch onto the skin 2 (two) times a week., Disp: , Rfl:  .  lamoTRIgine (LAMICTAL) 100 MG tablet, Take 0.5 tablets (50 mg total) by mouth daily., Disp: 90 tablet, Rfl: 0 .  meloxicam (MOBIC) 15 MG tablet, TAKE 1 TABLET DAILY, Disp: 90 tablet, Rfl: 1 .  minocycline (DYNACIN) 100 MG tablet, Take 100 mg by mouth once.  , Disp: , Rfl:  .  montelukast (SINGULAIR) 10 MG  tablet, Take 10 mg by mouth at bedtime., Disp: , Rfl:  .  NON FORMULARY, Allergy shot 1 x week, Disp: , Rfl:  .  progesterone (PROMETRIUM) 100 MG capsule, Take 100 mg by mouth daily., Disp: , Rfl:  .  SUMAtriptan (IMITREX) 100 MG tablet, Take 1 tablet (100 mg total) by mouth as needed for migraine. May repeat in 2 hours if headache persists or recurs., Disp: 10 tablet, Rfl: 2 .  traMADol (ULTRAM) 50 MG tablet, Take 2 tablets (100 mg total) by mouth every 6 (six) hours as needed for moderate pain., Disp: 120 tablet, Rfl: 2 .  traZODone (DESYREL) 100 MG tablet, Take 1 tablet (100 mg total) by mouth at bedtime., Disp: 30 tablet, Rfl: 2 .   zolpidem (AMBIEN) 10 MG tablet, TAKE 1&1/2 TABLETS AT BEDTIME AS NEEDED., Disp: 45 tablet, Rfl: 5  EXAM:  VITALS per patient if applicable:  GENERAL: alert, oriented, appears well and in no acute distress  HEENT: atraumatic, conjunttiva clear, no obvious abnormalities on inspection of external nose and ears  NECK: normal movements of the head and neck  LUNGS: on inspection no signs of respiratory distress, breathing rate appears normal, no obvious gross SOB, gasping or wheezing  CV: no obvious cyanosis  MS: moves all visible extremities without noticeable abnormality  PSYCH/NEURO: pleasant and cooperative, no obvious depression or anxiety, speech and thought processing grossly intact  ASSESSMENT AND PLAN: Tension headaches. She can try Fioricet as needed, and we will add Flexeril as needed. I also recommended she get an occasional massage. Recheck prn.  Gershon Crane, MD  Discussed the following assessment and plan:  No diagnosis found.     I discussed the assessment and treatment plan with the patient. The patient was provided an opportunity to ask questions and all were answered. The patient agreed with the plan and demonstrated an understanding of the instructions.   The patient was advised to call back or seek an in-person evaluation if the symptoms worsen or if the condition fails to improve as anticipated.     Review of Systems     Objective:   Physical Exam        Assessment & Plan:

## 2020-06-25 DIAGNOSIS — M898X4 Other specified disorders of bone, hand: Secondary | ICD-10-CM | POA: Diagnosis not present

## 2020-06-25 DIAGNOSIS — M67441 Ganglion, right hand: Secondary | ICD-10-CM | POA: Diagnosis not present

## 2020-06-27 DIAGNOSIS — R499 Unspecified voice and resonance disorder: Secondary | ICD-10-CM | POA: Diagnosis not present

## 2020-06-27 DIAGNOSIS — R0602 Shortness of breath: Secondary | ICD-10-CM | POA: Diagnosis not present

## 2020-06-27 DIAGNOSIS — J3089 Other allergic rhinitis: Secondary | ICD-10-CM | POA: Diagnosis not present

## 2020-06-27 DIAGNOSIS — H1045 Other chronic allergic conjunctivitis: Secondary | ICD-10-CM | POA: Diagnosis not present

## 2020-07-25 ENCOUNTER — Other Ambulatory Visit: Payer: Self-pay | Admitting: Family Medicine

## 2020-07-25 NOTE — Telephone Encounter (Signed)
LAST OV--04/25/2020 LAST REFILL 12/10/18  PLEASE  ADVISE

## 2020-08-20 ENCOUNTER — Other Ambulatory Visit: Payer: Self-pay | Admitting: Family Medicine

## 2020-08-20 ENCOUNTER — Telehealth: Payer: Self-pay | Admitting: Family Medicine

## 2020-08-20 NOTE — Telephone Encounter (Signed)
error 

## 2020-09-02 ENCOUNTER — Other Ambulatory Visit: Payer: Self-pay | Admitting: Obstetrics

## 2020-09-02 DIAGNOSIS — Z1231 Encounter for screening mammogram for malignant neoplasm of breast: Secondary | ICD-10-CM

## 2020-10-15 ENCOUNTER — Ambulatory Visit
Admission: RE | Admit: 2020-10-15 | Discharge: 2020-10-15 | Disposition: A | Discharge status: Home / Self Care | Payer: BC Managed Care – PPO | Source: Ambulatory Visit | Attending: Obstetrics | Admitting: Obstetrics

## 2020-10-15 ENCOUNTER — Other Ambulatory Visit: Payer: Self-pay

## 2020-10-15 DIAGNOSIS — Z1231 Encounter for screening mammogram for malignant neoplasm of breast: Secondary | ICD-10-CM | POA: Diagnosis not present

## 2020-12-17 DIAGNOSIS — M542 Cervicalgia: Secondary | ICD-10-CM | POA: Diagnosis not present

## 2020-12-17 DIAGNOSIS — M545 Low back pain, unspecified: Secondary | ICD-10-CM | POA: Diagnosis not present

## 2021-01-08 DIAGNOSIS — M545 Low back pain, unspecified: Secondary | ICD-10-CM | POA: Diagnosis not present

## 2021-01-08 DIAGNOSIS — M25532 Pain in left wrist: Secondary | ICD-10-CM | POA: Diagnosis not present

## 2021-01-08 DIAGNOSIS — M542 Cervicalgia: Secondary | ICD-10-CM | POA: Diagnosis not present

## 2021-01-22 ENCOUNTER — Other Ambulatory Visit: Payer: Self-pay | Admitting: Family Medicine

## 2021-01-22 NOTE — Telephone Encounter (Signed)
Last video visit- 04/25/2020 Last refill- 06/25/2014  No future visit scheduled

## 2021-01-28 ENCOUNTER — Ambulatory Visit
Admission: RE | Admit: 2021-01-28 | Discharge: 2021-01-28 | Disposition: A | Discharge status: Home / Self Care | Payer: BC Managed Care – PPO | Source: Ambulatory Visit | Attending: Orthopedic Surgery | Admitting: Orthopedic Surgery

## 2021-01-28 ENCOUNTER — Other Ambulatory Visit: Payer: Self-pay | Admitting: Orthopedic Surgery

## 2021-01-28 DIAGNOSIS — S52572D Other intraarticular fracture of lower end of left radius, subsequent encounter for closed fracture with routine healing: Secondary | ICD-10-CM | POA: Diagnosis not present

## 2021-01-28 DIAGNOSIS — R937 Abnormal findings on diagnostic imaging of other parts of musculoskeletal system: Secondary | ICD-10-CM | POA: Diagnosis not present

## 2021-01-28 DIAGNOSIS — M25532 Pain in left wrist: Secondary | ICD-10-CM

## 2021-01-28 DIAGNOSIS — S52502D Unspecified fracture of the lower end of left radius, subsequent encounter for closed fracture with routine healing: Secondary | ICD-10-CM | POA: Diagnosis not present

## 2021-01-28 DIAGNOSIS — S52572A Other intraarticular fracture of lower end of left radius, initial encounter for closed fracture: Secondary | ICD-10-CM | POA: Diagnosis not present

## 2021-01-28 DIAGNOSIS — S52502A Unspecified fracture of the lower end of left radius, initial encounter for closed fracture: Secondary | ICD-10-CM | POA: Diagnosis not present

## 2021-01-28 DIAGNOSIS — M7989 Other specified soft tissue disorders: Secondary | ICD-10-CM | POA: Diagnosis not present

## 2021-01-30 DIAGNOSIS — S52572D Other intraarticular fracture of lower end of left radius, subsequent encounter for closed fracture with routine healing: Secondary | ICD-10-CM | POA: Diagnosis not present

## 2021-02-19 ENCOUNTER — Other Ambulatory Visit: Payer: Self-pay | Admitting: Family Medicine

## 2021-02-19 DIAGNOSIS — R140 Abdominal distension (gaseous): Secondary | ICD-10-CM | POA: Diagnosis not present

## 2021-02-19 DIAGNOSIS — Z01419 Encounter for gynecological examination (general) (routine) without abnormal findings: Secondary | ICD-10-CM | POA: Diagnosis not present

## 2021-02-19 DIAGNOSIS — Z124 Encounter for screening for malignant neoplasm of cervix: Secondary | ICD-10-CM | POA: Diagnosis not present

## 2021-02-19 DIAGNOSIS — N951 Menopausal and female climacteric states: Secondary | ICD-10-CM | POA: Diagnosis not present

## 2021-02-19 DIAGNOSIS — Z681 Body mass index (BMI) 19 or less, adult: Secondary | ICD-10-CM | POA: Diagnosis not present

## 2021-02-19 DIAGNOSIS — R634 Abnormal weight loss: Secondary | ICD-10-CM | POA: Diagnosis not present

## 2021-02-19 DIAGNOSIS — Z01411 Encounter for gynecological examination (general) (routine) with abnormal findings: Secondary | ICD-10-CM | POA: Diagnosis not present

## 2021-02-19 DIAGNOSIS — B977 Papillomavirus as the cause of diseases classified elsewhere: Secondary | ICD-10-CM | POA: Diagnosis not present

## 2021-02-21 ENCOUNTER — Other Ambulatory Visit: Payer: Self-pay | Admitting: Obstetrics

## 2021-02-21 DIAGNOSIS — S62102A Fracture of unspecified carpal bone, left wrist, initial encounter for closed fracture: Secondary | ICD-10-CM

## 2021-02-24 ENCOUNTER — Telehealth (INDEPENDENT_AMBULATORY_CARE_PROVIDER_SITE_OTHER): Payer: BC Managed Care – PPO | Admitting: Family Medicine

## 2021-02-24 ENCOUNTER — Encounter: Payer: Self-pay | Admitting: Family Medicine

## 2021-02-24 DIAGNOSIS — K580 Irritable bowel syndrome with diarrhea: Secondary | ICD-10-CM

## 2021-02-24 DIAGNOSIS — F418 Other specified anxiety disorders: Secondary | ICD-10-CM

## 2021-02-24 DIAGNOSIS — K589 Irritable bowel syndrome without diarrhea: Secondary | ICD-10-CM | POA: Insufficient documentation

## 2021-02-24 MED ORDER — DIPHENOXYLATE-ATROPINE 2.5-0.025 MG PO TABS: 2.0000 | ORAL_TABLET | Freq: Four times a day (QID) | ORAL | 2 refills | Status: DC | PRN

## 2021-02-24 MED ORDER — ALPRAZOLAM 0.5 MG PO TABS: ORAL_TABLET | ORAL | 5 refills | Status: DC

## 2021-02-24 NOTE — Progress Notes (Signed)
Subjective:    Patient ID: Sierra Peters, female    DOB: Mar 14, 1961, 60 y.o.   MRN: [id number]  HPI Virtual Visit via Video Note  I connected with the patient on 02/24/21 at  9:30 AM EDT by a video enabled telemedicine application and verified that I am speaking with the correct person using two identifiers.  Location patient: home Location provider:work or home office Persons participating in the virtual visit: patient, provider  I discussed the limitations of evaluation and management by telemedicine and the availability of in person appointments. The patient expressed understanding and agreed to proceed.   HPI: Here to get refills on Xanax, but she also has a new issue to discuss. About 10 years ago she started to occasionally have spells of lower abdominal cramping, nausea without vomiting, and sudden uncontrollable diarrhea. This used to be rare, but over the past month it has become more common, maybe several times a week. It always occurs in the morning. It is not predictable, she says stress does not play a role. Her diet does not seem to make a difference. She drinks coffee in the mornings, buut it can happen even if she has no coffee at all. Her last colonoscopy in 2014 was normal. She mentioned this to her GYN last week, and she has ordered an abdominal US. No fevers, no blood in the stools. She has lost about 5 lbs in the past few months. Appetite is normal.    ROS: See pertinent positives and negatives per HPI.  Past Medical History:  Diagnosis Date  . Allergy    sees Dr. Corinda Gubler  . Anxiety   . Chronic fatigue   . Depression   . Headache(784.0)   . MVP (mitral valve prolapse)     Past Surgical History:  Procedure Laterality Date  . APPENDECTOMY    . AUGMENTATION MAMMAPLASTY Bilateral 09/2017   silicone   . BREAST SURGERY     augmentation per Dr. Stephens November  . COLONOSCOPY  02/07/2013   per Dr. Ewing Schlein, sigmoid diverticula, no polyps, repeat in 10 yrs   .  ESOPHAGOGASTRODUODENOSCOPY  02/07/2013   per Dr. Ewing Schlein, esophageal erosions, hiatal hernia   . TUBAL LIGATION      Family History  Problem Relation Age of Onset  . Arthritis Other   . Heart disease Other        cardiovascular disorder     Current Outpatient Medications:  .  butalbital-acetaminophen-caffeine (FIORICET) 50-325-40 MG tablet, Take 1 tablet by mouth every 4 (four) hours as needed (for tension headaches)., Disp: 60 tablet, Rfl: 2 .  cyclobenzaprine (FLEXERIL) 10 MG tablet, Take 1 tablet (10 mg total) by mouth 3 (three) times daily as needed for muscle spasms., Disp: 60 tablet, Rfl: 2 .  desloratadine-pseudoephedrine (CLARINEX-D 24-HOUR) 5-240 MG per 24 hr tablet, Take 1 tablet by mouth daily., Disp: , Rfl:  .  Dexlansoprazole (DEXILANT PO), Take by mouth daily., Disp: , Rfl:  .  dicyclomine (BENTYL) 20 MG tablet, Take 1 tablet (20 mg total) by mouth every 6 (six) hours as needed (abdominal cramps)., Disp: 60 tablet, Rfl: 1 .  diphenoxylate-atropine (LOMOTIL) 2.5-0.025 MG tablet, Take 2 tablets by mouth 4 (four) times daily as needed for diarrhea or loose stools., Disp: 120 tablet, Rfl: 2 .  DULoxetine (CYMBALTA) 60 MG capsule, Take 1 capsule (60 mg total) by mouth daily., Disp: 30 capsule, Rfl: 3 .  estradiol (VIVELLE-DOT) 0.05 MG/24HR patch, Place 1 patch onto the skin 2 (two) times a  week., Disp: , Rfl:  .  lamoTRIgine (LAMICTAL) 100 MG tablet, Take 0.5 tablets (50 mg total) by mouth daily., Disp: 90 tablet, Rfl: 0 .  meloxicam (MOBIC) 15 MG tablet, TAKE 1 TABLET ONCE DAILY., Disp: 90 tablet, Rfl: 3 .  minocycline (DYNACIN) 100 MG tablet, Take 100 mg by mouth once., Disp: , Rfl:  .  montelukast (SINGULAIR) 10 MG tablet, Take 10 mg by mouth at bedtime., Disp: , Rfl:  .  NON FORMULARY, Allergy shot 1 x week, Disp: , Rfl:  .  progesterone (PROMETRIUM) 100 MG capsule, Take 100 mg by mouth daily., Disp: , Rfl:  .  SUMAtriptan (IMITREX) 100 MG tablet, Take 1 tablet (100 mg total)  by mouth as needed for migraine. May repeat in 2 hours if headache persists or recurs., Disp: 10 tablet, Rfl: 2 .  traMADol (ULTRAM) 50 MG tablet, Take 2 tablets (100 mg total) by mouth every 6 (six) hours as needed for moderate pain., Disp: 120 tablet, Rfl: 2 .  traZODone (DESYREL) 100 MG tablet, Take 1 tablet (100 mg total) by mouth at bedtime., Disp: 30 tablet, Rfl: 2 .  zolpidem (AMBIEN) 10 MG tablet, TAKE 1&1/2 TABLETS AT BEDTIME AS NEEDED., Disp: 45 tablet, Rfl: 5 .  ALPRAZolam (XANAX) 0.5 MG tablet, TAKE 1 TABLET 3 TIMES A DAY AS NEEDED., Disp: 90 tablet, Rfl: 5  EXAM:  VITALS per patient if applicable:  GENERAL: alert, oriented, appears well and in no acute distress  HEENT: atraumatic, conjunttiva clear, no obvious abnormalities on inspection of external nose and ears  NECK: normal movements of the head and neck  LUNGS: on inspection no signs of respiratory distress, breathing rate appears normal, no obvious gross SOB, gasping or wheezing  CV: no obvious cyanosis  MS: moves all visible extremities without noticeable abnormality  PSYCH/NEURO: pleasant and cooperative, no obvious depression or anxiety, speech and thought processing grossly intact  ASSESSMENT AND PLAN: Her anxiety is stable, so we refilled the Xanax. She has IBS with spells of diarrhea. She will start taking a probiotic (Align) daily and she can try Lomotil in the mornings as needed. sje could even take this every morning if she wishes. Report back in one month. Gershon Crane, MD  Discussed the following assessment and plan:  No diagnosis found.     I discussed the assessment and treatment plan with the patient. The patient was provided an opportunity to ask questions and all were answered. The patient agreed with the plan and demonstrated an understanding of the instructions.   The patient was advised to call back or seek an in-person evaluation if the symptoms worsen or if the condition fails to improve as  anticipated.     Review of Systems  Constitutional: Negative.   Respiratory: Negative.   Gastrointestinal: Positive for abdominal pain, diarrhea and nausea. Negative for abdominal distention, anal bleeding, blood in stool, constipation, rectal pain and vomiting.  Genitourinary: Negative.   Psychiatric/Behavioral: Negative.        Objective:   Physical Exam        Assessment & Plan:

## 2021-03-18 ENCOUNTER — Other Ambulatory Visit: Payer: Self-pay | Admitting: Family Medicine

## 2021-03-18 DIAGNOSIS — F418 Other specified anxiety disorders: Secondary | ICD-10-CM

## 2021-03-25 DIAGNOSIS — R140 Abdominal distension (gaseous): Secondary | ICD-10-CM | POA: Diagnosis not present

## 2021-03-25 DIAGNOSIS — R634 Abnormal weight loss: Secondary | ICD-10-CM | POA: Diagnosis not present

## 2021-04-04 ENCOUNTER — Other Ambulatory Visit: Payer: Self-pay | Admitting: Family Medicine

## 2021-04-04 NOTE — Telephone Encounter (Signed)
Last video visit- 02/24/21 Last refill- 08/26/20--45 tabs, 5 refills  No future visit scheduled

## 2021-06-20 ENCOUNTER — Other Ambulatory Visit: Payer: Self-pay | Admitting: Family Medicine

## 2021-06-20 DIAGNOSIS — F418 Other specified anxiety disorders: Secondary | ICD-10-CM

## 2021-08-13 ENCOUNTER — Other Ambulatory Visit: Payer: Self-pay | Admitting: Family Medicine

## 2021-08-13 ENCOUNTER — Telehealth: Payer: Self-pay

## 2021-08-13 NOTE — Telephone Encounter (Signed)
Pt Rx for Lomotil was printed and faxed to pt [facility]

## 2021-09-03 DIAGNOSIS — J3089 Other allergic rhinitis: Secondary | ICD-10-CM | POA: Diagnosis not present

## 2021-09-03 DIAGNOSIS — H1045 Other chronic allergic conjunctivitis: Secondary | ICD-10-CM | POA: Diagnosis not present

## 2021-09-03 DIAGNOSIS — R0602 Shortness of breath: Secondary | ICD-10-CM | POA: Diagnosis not present

## 2021-09-03 DIAGNOSIS — R499 Unspecified voice and resonance disorder: Secondary | ICD-10-CM | POA: Diagnosis not present

## 2021-09-16 ENCOUNTER — Other Ambulatory Visit: Payer: Self-pay | Admitting: Family Medicine

## 2021-09-16 DIAGNOSIS — F418 Other specified anxiety disorders: Secondary | ICD-10-CM

## 2021-09-25 ENCOUNTER — Other Ambulatory Visit: Payer: Self-pay | Admitting: Family Medicine

## 2021-09-25 NOTE — Telephone Encounter (Signed)
Last refill- 02/24/21--90 tabs, 5 refills Last VV- 02/24/21  No future OV scheduled.  Can this patient receive a refill?

## 2021-10-16 DIAGNOSIS — M545 Low back pain, unspecified: Secondary | ICD-10-CM | POA: Diagnosis not present

## 2021-11-03 ENCOUNTER — Other Ambulatory Visit: Payer: Self-pay | Admitting: Family Medicine

## 2021-11-03 DIAGNOSIS — F418 Other specified anxiety disorders: Secondary | ICD-10-CM

## 2021-11-04 DIAGNOSIS — J0110 Acute frontal sinusitis, unspecified: Secondary | ICD-10-CM | POA: Diagnosis not present

## 2021-11-24 ENCOUNTER — Other Ambulatory Visit: Payer: Self-pay | Admitting: Family Medicine

## 2021-11-24 DIAGNOSIS — F418 Other specified anxiety disorders: Secondary | ICD-10-CM

## 2021-12-17 ENCOUNTER — Other Ambulatory Visit: Payer: Self-pay | Admitting: Family Medicine

## 2021-12-19 NOTE — Telephone Encounter (Signed)
Pt LOV was done on 02/24/2021 ?Last refill done on 04/04/2021 ?Please advise ?

## 2022-01-02 ENCOUNTER — Other Ambulatory Visit: Payer: Self-pay

## 2022-01-02 ENCOUNTER — Other Ambulatory Visit: Payer: Self-pay | Admitting: Family Medicine

## 2022-01-02 DIAGNOSIS — F418 Other specified anxiety disorders: Secondary | ICD-10-CM

## 2022-01-08 ENCOUNTER — Other Ambulatory Visit: Payer: Self-pay | Admitting: Family Medicine

## 2022-01-08 NOTE — Telephone Encounter (Signed)
Last refill- 11/23//22 ?Last VV- 02/24/2021 ? ?No future OV scheduled.   Can this patient receive a refill. ?

## 2022-02-05 ENCOUNTER — Other Ambulatory Visit: Payer: Self-pay | Admitting: Family Medicine

## 2022-02-05 DIAGNOSIS — F418 Other specified anxiety disorders: Secondary | ICD-10-CM

## 2022-02-11 ENCOUNTER — Other Ambulatory Visit: Payer: Self-pay | Admitting: Family Medicine

## 2022-03-06 ENCOUNTER — Telehealth: Payer: Self-pay | Admitting: Family Medicine

## 2022-03-06 ENCOUNTER — Other Ambulatory Visit: Payer: Self-pay | Admitting: Family Medicine

## 2022-03-06 ENCOUNTER — Other Ambulatory Visit: Payer: Self-pay

## 2022-03-06 DIAGNOSIS — F418 Other specified anxiety disorders: Secondary | ICD-10-CM

## 2022-03-06 MED ORDER — ESCITALOPRAM OXALATE 20 MG PO TABS: ORAL_TABLET | ORAL | 0 refills | Status: DC

## 2022-03-06 NOTE — Telephone Encounter (Signed)
Lvm 30 day supply for Escitalopram 20mg  sent to .

## 2022-03-06 NOTE — Telephone Encounter (Signed)
Pt needs refill of    only has a few left. Pharmacy would not refill without an appointment, scheduled for 6/21 @ 10 and requesting a fill to last thru her appointment so that she does not run out,

## 2022-03-11 ENCOUNTER — Encounter: Payer: Self-pay | Admitting: Family Medicine

## 2022-03-11 ENCOUNTER — Telehealth (INDEPENDENT_AMBULATORY_CARE_PROVIDER_SITE_OTHER): Payer: BC Managed Care – PPO | Admitting: Family Medicine

## 2022-03-11 DIAGNOSIS — F418 Other specified anxiety disorders: Secondary | ICD-10-CM

## 2022-03-11 MED ORDER — LAMOTRIGINE 100 MG PO TABS: 100.0000 mg | ORAL_TABLET | Freq: Every day | ORAL | 3 refills | Status: DC

## 2022-03-11 MED ORDER — ALPRAZOLAM 0.5 MG PO TABS: 0.5000 mg | ORAL_TABLET | Freq: Three times a day (TID) | ORAL | 5 refills | Status: DC | PRN

## 2022-03-11 MED ORDER — ESCITALOPRAM OXALATE 20 MG PO TABS: ORAL_TABLET | ORAL | 3 refills | Status: DC

## 2022-03-11 NOTE — Progress Notes (Signed)
Subjective:    Patient ID: Sierra Peters, female    DOB: May 04, 1961, 61 y.o.   MRN: [id number]  HPI Virtual Visit via Video Note  I connected with the patient on 03/11/22 at 10:00 AM EDT by a video enabled telemedicine application and verified that I am speaking with the correct person using two identifiers.  Location patient: home Location provider:work or home office Persons participating in the virtual visit: patient, provider  I discussed the limitations of evaluation and management by telemedicine and the availability of in person appointments. The patient expressed understanding and agreed to proceed.   HPI: Here to follow up on depression with anxiety. She has been taking Lexapro 20 mg daily, Xanax 0.5 mg 1/2 tablet once a day in the mornings, and Lamotrigine 100 mg 1/2 tablet at night. She says her job has been extremely stressful and she struggles to keep with the demands placed on her. She feels very anxious all the time. She sleeps well however.    ROS: See pertinent positives and negatives per HPI.  Past Medical History:  Diagnosis Date   Allergy    sees Dr. Corinda Gubler   Anxiety    Chronic fatigue    Depression    Headache(784.0)    MVP (mitral valve prolapse)     Past Surgical History:  Procedure Laterality Date   APPENDECTOMY     AUGMENTATION MAMMAPLASTY Bilateral 09/2017   silicone    BREAST SURGERY     augmentation per Dr. Stephens November   COLONOSCOPY  02/07/2013   per Dr. Ewing Schlein, sigmoid diverticula, no polyps, repeat in 10 yrs    ESOPHAGOGASTRODUODENOSCOPY  02/07/2013   per Dr. Ewing Schlein, esophageal erosions, hiatal hernia    TUBAL LIGATION      Family History  Problem Relation Age of Onset   Arthritis Other    Heart disease Other        cardiovascular disorder     Current Outpatient Medications:    ALPRAZolam (XANAX) 0.5 MG tablet, Take 1 tablet (0.5 mg total) by mouth 3 (three) times daily as needed for anxiety., Disp: 90 tablet, Rfl: 5    butalbital-acetaminophen-caffeine (FIORICET) 50-325-40 MG tablet, Take 1 tablet by mouth every 4 (four) hours as needed (for tension headaches)., Disp: 60 tablet, Rfl: 2   cyclobenzaprine (FLEXERIL) 10 MG tablet, Take 1 tablet (10 mg total) by mouth 3 (three) times daily as needed for muscle spasms., Disp: 60 tablet, Rfl: 2   desloratadine-pseudoephedrine (CLARINEX-D 24-HOUR) 5-240 MG per 24 hr tablet, Take 1 tablet by mouth daily., Disp: , Rfl:    Dexlansoprazole (DEXILANT PO), Take by mouth daily., Disp: , Rfl:    dicyclomine (BENTYL) 20 MG tablet, Take 1 tablet (20 mg total) by mouth every 6 (six) hours as needed (abdominal cramps)., Disp: 60 tablet, Rfl: 1   diphenoxylate-atropine (LOMOTIL) 2.5-0.025 MG tablet, TAKE 2 TABLETS 4 TIMES A DAY AS NEEDED FOR DIARRHEA OR LOOSE STOOLS., Disp: 120 tablet, Rfl: 5   escitalopram (LEXAPRO) 20 MG tablet, TAKE ONE TABLET BY MOUTH DAILY, Disp: 90 tablet, Rfl: 3   estradiol (VIVELLE-DOT) 0.05 MG/24HR patch, Place 1 patch onto the skin 2 (two) times a week., Disp: , Rfl:    lamoTRIgine (LAMICTAL) 100 MG tablet, Take 0.5 tablets (50 mg total) by mouth daily., Disp: 90 tablet, Rfl: 0   meloxicam (MOBIC) 15 MG tablet, TAKE 1 TABLET BY MOUTH ONCE DAILY., Disp: 90 tablet, Rfl: 0   minocycline (DYNACIN) 100 MG tablet, Take 100 mg by mouth  once., Disp: , Rfl:    montelukast (SINGULAIR) 10 MG tablet, Take 10 mg by mouth at bedtime., Disp: , Rfl:    NON FORMULARY, Allergy shot 1 x week, Disp: , Rfl:    progesterone (PROMETRIUM) 100 MG capsule, Take 100 mg by mouth daily., Disp: , Rfl:    SUMAtriptan (IMITREX) 100 MG tablet, Take 1 tablet (100 mg total) by mouth as needed for migraine. May repeat in 2 hours if headache persists or recurs., Disp: 10 tablet, Rfl: 2   traMADol (ULTRAM) 50 MG tablet, Take 2 tablets (100 mg total) by mouth every 6 (six) hours as needed for moderate pain., Disp: 120 tablet, Rfl: 2   zolpidem (AMBIEN) 10 MG tablet, TAKE 1&1/2 TABLETS AT BEDTIME  AS NEEDED., Disp: 45 tablet, Rfl: 5  EXAM:  VITALS per patient if applicable:  GENERAL: alert, oriented, appears well and in no acute distress  HEENT: atraumatic, conjunttiva clear, no obvious abnormalities on inspection of external nose and ears  NECK: normal movements of the head and neck  LUNGS: on inspection no signs of respiratory distress, breathing rate appears normal, no obvious gross SOB, gasping or wheezing  CV: no obvious cyanosis  MS: moves all visible extremities without noticeable abnormality  PSYCH/NEURO: pleasant and cooperative, no obvious depression or anxiety, speech and thought processing grossly intact  ASSESSMENT AND PLAN: Depression with anxiety. My first bit of advice for her is to consider finding a different job. She says she is thinking about this. In the meantime she will increase the Xanax to taking 1/2 tablet twice a day, and she will increase the Lamotrigine to taking a full tablet at night. Recheck as needed.  Gershon Crane, MD  Discussed the following assessment and plan:  Other specified anxiety disorders - Plan: escitalopram (LEXAPRO) 20 MG tablet     I discussed the assessment and treatment plan with the patient. The patient was provided an opportunity to ask questions and all were answered. The patient agreed with the plan and demonstrated an understanding of the instructions.   The patient was advised to call back or seek an in-person evaluation if the symptoms worsen or if the condition fails to improve as anticipated.      Review of Systems     Objective:   Physical Exam        Assessment & Plan:

## 2022-03-26 DIAGNOSIS — Z681 Body mass index (BMI) 19 or less, adult: Secondary | ICD-10-CM | POA: Diagnosis not present

## 2022-03-26 DIAGNOSIS — Z01419 Encounter for gynecological examination (general) (routine) without abnormal findings: Secondary | ICD-10-CM | POA: Diagnosis not present

## 2022-05-16 ENCOUNTER — Other Ambulatory Visit: Payer: Self-pay | Admitting: Family Medicine

## 2022-07-31 ENCOUNTER — Other Ambulatory Visit: Payer: Self-pay | Admitting: Family Medicine

## 2022-07-31 NOTE — Telephone Encounter (Signed)
Pt LOV was on 01/13/2022 Last refill was done on 01/13/22 Please advise

## 2022-08-19 ENCOUNTER — Other Ambulatory Visit: Payer: Self-pay | Admitting: Family Medicine

## 2022-08-27 ENCOUNTER — Other Ambulatory Visit: Payer: Self-pay | Admitting: Family Medicine

## 2022-08-27 NOTE — Telephone Encounter (Signed)
Pt LOV wad on 03/11/22 Last refill was done on 12/19/21 Please advise

## 2022-09-08 ENCOUNTER — Other Ambulatory Visit: Payer: Self-pay | Admitting: Family Medicine

## 2022-09-08 DIAGNOSIS — L570 Actinic keratosis: Secondary | ICD-10-CM | POA: Diagnosis not present

## 2022-09-08 DIAGNOSIS — Z85820 Personal history of malignant melanoma of skin: Secondary | ICD-10-CM | POA: Diagnosis not present

## 2022-09-08 DIAGNOSIS — Z85828 Personal history of other malignant neoplasm of skin: Secondary | ICD-10-CM | POA: Diagnosis not present

## 2022-09-08 DIAGNOSIS — L718 Other rosacea: Secondary | ICD-10-CM | POA: Diagnosis not present

## 2022-09-08 DIAGNOSIS — L905 Scar conditions and fibrosis of skin: Secondary | ICD-10-CM | POA: Diagnosis not present

## 2022-09-08 NOTE — Telephone Encounter (Signed)
Last VV-03/11/22 Last refill-03/11/22--90 tabs, 5 refills  No future OV scheduled.

## 2022-09-17 ENCOUNTER — Other Ambulatory Visit: Payer: Self-pay | Admitting: Family Medicine

## 2022-10-17 ENCOUNTER — Other Ambulatory Visit: Payer: Self-pay | Admitting: Family Medicine

## 2022-10-17 DIAGNOSIS — F418 Other specified anxiety disorders: Secondary | ICD-10-CM

## 2022-11-21 ENCOUNTER — Other Ambulatory Visit: Payer: Self-pay | Admitting: Family Medicine

## 2022-12-11 ENCOUNTER — Telehealth (INDEPENDENT_AMBULATORY_CARE_PROVIDER_SITE_OTHER): Payer: No Typology Code available for payment source | Admitting: Family Medicine

## 2022-12-11 ENCOUNTER — Encounter: Payer: Self-pay | Admitting: Family Medicine

## 2022-12-11 DIAGNOSIS — F5101 Primary insomnia: Secondary | ICD-10-CM

## 2022-12-11 DIAGNOSIS — F418 Other specified anxiety disorders: Secondary | ICD-10-CM

## 2022-12-11 DIAGNOSIS — M1990 Unspecified osteoarthritis, unspecified site: Secondary | ICD-10-CM | POA: Insufficient documentation

## 2022-12-11 DIAGNOSIS — G44219 Episodic tension-type headache, not intractable: Secondary | ICD-10-CM

## 2022-12-11 DIAGNOSIS — K580 Irritable bowel syndrome with diarrhea: Secondary | ICD-10-CM | POA: Diagnosis not present

## 2022-12-11 DIAGNOSIS — M159 Polyosteoarthritis, unspecified: Secondary | ICD-10-CM | POA: Diagnosis not present

## 2022-12-11 NOTE — Progress Notes (Signed)
Subjective:    Patient ID: Sierra Peters, female    DOB: March 29, 1961, 62 y.o.   MRN: [id number]  HPI Virtual Visit via Video Note  I connected with the patient on 12/11/22 at  2:15 PM EDT by a video enabled telemedicine application and verified that I am speaking with the correct person using two identifiers.  Location patient: home Location provider:work or home office Persons participating in the virtual visit: patient, provider  I discussed the limitations of evaluation and management by telemedicine and the availability of in person appointments. The patient expressed understanding and agreed to proceed.   HPI: Here for medication refills. She is having trouble with joint pains despite taking Meloxicam. Her hands give her more trouble than anywhere else. Ice packs help. Her depression and anxiety are stable. Her headaches are well controlled.    ROS: See pertinent positives and negatives per HPI.  Past Medical History:  Diagnosis Date   Allergy    sees Dr. Velora Heckler   Anxiety    Chronic fatigue    Depression    Headache(784.0)    MVP (mitral valve prolapse)     Past Surgical History:  Procedure Laterality Date   APPENDECTOMY     AUGMENTATION MAMMAPLASTY Bilateral 0000000   silicone    BREAST SURGERY     augmentation per Dr. Dessie Coma   COLONOSCOPY  02/07/2013   per Dr. Watt Climes, sigmoid diverticula, no polyps, repeat in 10 yrs    ESOPHAGOGASTRODUODENOSCOPY  02/07/2013   per Dr. Watt Climes, esophageal erosions, hiatal hernia    TUBAL LIGATION      Family History  Problem Relation Age of Onset   Arthritis Other    Heart disease Other        cardiovascular disorder     Current Outpatient Medications:    ALPRAZolam (XANAX) 0.5 MG tablet, Take 1 tablet (0.5 mg total) by mouth 3 (three) times daily as needed for anxiety., Disp: 90 tablet, Rfl: 5   butalbital-acetaminophen-caffeine (FIORICET) 50-325-40 MG tablet, Take 1 tablet by mouth every 4 (four) hours as needed (for  tension headaches)., Disp: 60 tablet, Rfl: 2   cyclobenzaprine (FLEXERIL) 10 MG tablet, Take 1 tablet (10 mg total) by mouth 3 (three) times daily as needed for muscle spasms., Disp: 60 tablet, Rfl: 2   desloratadine-pseudoephedrine (CLARINEX-D 24-HOUR) 5-240 MG per 24 hr tablet, Take 1 tablet by mouth daily., Disp: , Rfl:    Dexlansoprazole (DEXILANT PO), Take by mouth daily., Disp: , Rfl:    dicyclomine (BENTYL) 20 MG tablet, Take 1 tablet (20 mg total) by mouth every 6 (six) hours as needed (abdominal cramps)., Disp: 60 tablet, Rfl: 1   diphenoxylate-atropine (LOMOTIL) 2.5-0.025 MG tablet, TAKE TWO TABLETS FOUR TIMES DAILY AS NEEDED FOR diarrhea OR loose stools, Disp: 120 tablet, Rfl: 5   escitalopram (LEXAPRO) 20 MG tablet, TAKE ONE TABLET BY MOUTH DAILY, Disp: 30 tablet, Rfl: 0   estradiol (VIVELLE-DOT) 0.05 MG/24HR patch, Place 1 patch onto the skin 2 (two) times a week., Disp: , Rfl:    lamoTRIgine (LAMICTAL) 100 MG tablet, Take 1 tablet (100 mg total) by mouth daily., Disp: 90 tablet, Rfl: 3   meloxicam (MOBIC) 15 MG tablet, Take 1 tablet (15 mg total) by mouth daily. *Appointment required for future refills, Disp: 30 tablet, Rfl: 0   minocycline (DYNACIN) 100 MG tablet, Take 100 mg by mouth once., Disp: , Rfl:    montelukast (SINGULAIR) 10 MG tablet, Take 10 mg by mouth at bedtime., Disp: ,  Rfl:    NON FORMULARY, Allergy shot 1 x week, Disp: , Rfl:    progesterone (PROMETRIUM) 100 MG capsule, Take 100 mg by mouth daily., Disp: , Rfl:    SUMAtriptan (IMITREX) 100 MG tablet, Take 1 tablet (100 mg total) by mouth as needed for migraine. May repeat in 2 hours if headache persists or recurs., Disp: 10 tablet, Rfl: 2   traMADol (ULTRAM) 50 MG tablet, Take 2 tablets (100 mg total) by mouth every 6 (six) hours as needed for moderate pain., Disp: 120 tablet, Rfl: 2   zolpidem (AMBIEN) 10 MG tablet, TAKE 1&1/2 TABLETS AT BEDTIME AS NEEDED., Disp: 45 tablet, Rfl: 5  EXAM:  VITALS per patient if  applicable:  GENERAL: alert, oriented, appears well and in no acute distress  HEENT: atraumatic, conjunttiva clear, no obvious abnormalities on inspection of external nose and ears  NECK: normal movements of the head and neck  LUNGS: on inspection no signs of respiratory distress, breathing rate appears normal, no obvious gross SOB, gasping or wheezing  CV: no obvious cyanosis  MS: moves all visible extremities without noticeable abnormality  PSYCH/NEURO: pleasant and cooperative, no obvious depression or anxiety, speech and thought processing grossly intact  ASSESSMENT AND PLAN:  Discussed the following assessment and plan: For the OA, we will refill Meloxicam. I also suggested she try Voltaren gel OTC on  her hands for pain relief. Her depression and anxiety are stable. She will continue on Lexapro and Xanax. Her headaches and IBS have been well controlled. She is past due for a well exam and labs, so she will set this up soon.  Alysia Penna, MD  No diagnosis found.     I discussed the assessment and treatment plan with the patient. The patient was provided an opportunity to ask questions and all were answered. The patient agreed with the plan and demonstrated an understanding of the instructions.   The patient was advised to call back or seek an in-person evaluation if the symptoms worsen or if the condition fails to improve as anticipated.      Review of Systems     Objective:   Physical Exam        Assessment & Plan:

## 2022-12-25 ENCOUNTER — Other Ambulatory Visit: Payer: Self-pay | Admitting: Family Medicine

## 2022-12-25 DIAGNOSIS — F418 Other specified anxiety disorders: Secondary | ICD-10-CM

## 2022-12-25 DIAGNOSIS — M159 Polyosteoarthritis, unspecified: Secondary | ICD-10-CM

## 2023-02-26 ENCOUNTER — Other Ambulatory Visit: Payer: Self-pay | Admitting: Family Medicine

## 2023-02-26 DIAGNOSIS — M150 Primary generalized (osteo)arthritis: Secondary | ICD-10-CM

## 2023-02-26 DIAGNOSIS — M159 Polyosteoarthritis, unspecified: Secondary | ICD-10-CM

## 2023-03-01 NOTE — Telephone Encounter (Signed)
Pt LOV was 12/11/22 Last refill for Ambien was on 08/28/2022 Last refill for Lomotil was done on 08/03/2022 Please advise

## 2023-03-17 LAB — LAB REPORT - SCANNED: EGFR: 71

## 2023-04-10 ENCOUNTER — Other Ambulatory Visit: Payer: Self-pay | Admitting: Family Medicine

## 2023-04-12 NOTE — Telephone Encounter (Signed)
Pt has enough medication to last for 3 days

## 2023-04-12 NOTE — Telephone Encounter (Signed)
Pt LOV was on 12/11/22 Last refill was done on 09/08/22 Please advise

## 2023-04-26 ENCOUNTER — Ambulatory Visit (INDEPENDENT_AMBULATORY_CARE_PROVIDER_SITE_OTHER): Payer: No Typology Code available for payment source

## 2023-04-26 ENCOUNTER — Telehealth (INDEPENDENT_AMBULATORY_CARE_PROVIDER_SITE_OTHER): Payer: No Typology Code available for payment source | Admitting: Family Medicine

## 2023-04-26 ENCOUNTER — Encounter: Payer: Self-pay | Admitting: Family Medicine

## 2023-04-26 VITALS — Ht 64.0 in | Wt 104.0 lb

## 2023-04-26 DIAGNOSIS — U071 COVID-19: Secondary | ICD-10-CM

## 2023-04-26 DIAGNOSIS — J029 Acute pharyngitis, unspecified: Secondary | ICD-10-CM

## 2023-04-26 LAB — POCT RAPID STREP A (OFFICE): Rapid Strep A Screen: NEGATIVE

## 2023-04-26 LAB — POC COVID19 BINAXNOW: SARS Coronavirus 2 Ag: POSITIVE — AB

## 2023-04-26 MED ORDER — NIRMATRELVIR/RITONAVIR (PAXLOVID)TABLET: 3.0000 | ORAL_TABLET | Freq: Two times a day (BID) | ORAL | 0 refills | Status: AC

## 2023-04-26 NOTE — Progress Notes (Signed)
Subjective:    Patient ID: Sierra Peters, female    DOB: [birth year], 62 y.o.   MRN: [id number]  HPI Virtual Visit via Video Note  I connected with the patient on 04/26/23 at  4:00 PM EDT by a video enabled telemedicine application and verified that I am speaking with the correct person using two identifiers.  Location patient: home Location provider:work or home office Persons participating in the virtual visit: patient, provider  I discussed the limitations of evaluation and management by telemedicine and the availability of in person appointments. The patient expressed understanding and agreed to proceed.   HPI: Here for 3 days of dry cough, headache, ear pressure, and ST. No body aches or SOB. Taking Sudafed.    ROS: See pertinent positives and negatives per HPI.  Past Medical History:  Diagnosis Date   Allergy    sees Dr. Corinda Gubler   Anxiety    Chronic fatigue    Depression    Headache(784.0)    MVP (mitral valve prolapse)     Past Surgical History:  Procedure Laterality Date   APPENDECTOMY     AUGMENTATION MAMMAPLASTY Bilateral 09/2017   silicone    BREAST SURGERY     augmentation per Dr. Stephens November   COLONOSCOPY  02/07/2013   per Dr. Ewing Schlein, sigmoid diverticula, no polyps, repeat in 10 yrs    ESOPHAGOGASTRODUODENOSCOPY  02/07/2013   per Dr. Ewing Schlein, esophageal erosions, hiatal hernia    TUBAL LIGATION      Family History  Problem Relation Age of Onset   Arthritis Other    Heart disease Other        cardiovascular disorder     Current Outpatient Medications:    ALPRAZolam (XANAX) 0.5 MG tablet, Take 1 tablet (0.5 mg total) by mouth 3 (three) times daily as needed for anxiety., Disp: 90 tablet, Rfl: 5   desloratadine-pseudoephedrine (CLARINEX-D 24-HOUR) 5-240 MG per 24 hr tablet, Take 1 tablet by mouth daily., Disp: , Rfl:    escitalopram (LEXAPRO) 20 MG tablet, TAKE ONE TABLET BY MOUTH DAILY, Disp: 30 tablet, Rfl: 0   estradiol (VIVELLE-DOT) 0.05 MG/24HR  patch, Place 1 patch onto the skin 2 (two) times a week., Disp: , Rfl:    lamoTRIgine (LAMICTAL) 100 MG tablet, Take 1 tablet (100 mg total) by mouth daily., Disp: 90 tablet, Rfl: 0   meloxicam (MOBIC) 15 MG tablet, Take 1 tablet (15 mg total) by mouth daily., Disp: 90 tablet, Rfl: 5   minocycline (DYNACIN) 100 MG tablet, Take 100 mg by mouth once., Disp: , Rfl:    montelukast (SINGULAIR) 10 MG tablet, Take 10 mg by mouth at bedtime., Disp: , Rfl:    progesterone (PROMETRIUM) 100 MG capsule, Take 100 mg by mouth daily., Disp: , Rfl:    zolpidem (AMBIEN) 10 MG tablet, TAKE 1&1/2 TABLETS AT BEDTIME AS NEEDED., Disp: 45 tablet, Rfl: 5  EXAM:  VITALS per patient if applicable:  GENERAL: alert, oriented, appears well and in no acute distress  HEENT: atraumatic, conjunttiva clear, no obvious abnormalities on inspection of external nose and ears  NECK: normal movements of the head and neck  LUNGS: on inspection no signs of respiratory distress, breathing rate appears normal, no obvious gross SOB, gasping or wheezing  CV: no obvious cyanosis  MS: moves all visible extremities without noticeable abnormality  PSYCH/NEURO: pleasant and cooperative, no obvious depression or anxiety, speech and thought processing grossly intact  ASSESSMENT AND PLAN: Covid infection, treat with 5 days of Paxlovid. She  will work from home the rest of this week.  Gershon Crane, MD  Discussed the following assessment and plan:  No diagnosis found.     I discussed the assessment and treatment plan with the patient. The patient was provided an opportunity to ask questions and all were answered. The patient agreed with the plan and demonstrated an understanding of the instructions.   The patient was advised to call back or seek an in-person evaluation if the symptoms worsen or if the condition fails to improve as anticipated.      Review of Systems     Objective:   Physical Exam        Assessment &  Plan:

## 2023-05-18 ENCOUNTER — Other Ambulatory Visit: Payer: Self-pay | Admitting: Family Medicine

## 2023-05-18 DIAGNOSIS — F418 Other specified anxiety disorders: Secondary | ICD-10-CM

## 2023-06-19 ENCOUNTER — Other Ambulatory Visit: Payer: Self-pay | Admitting: Family Medicine

## 2023-06-19 DIAGNOSIS — F418 Other specified anxiety disorders: Secondary | ICD-10-CM

## 2023-07-10 ENCOUNTER — Other Ambulatory Visit: Payer: Self-pay | Admitting: Family Medicine

## 2023-07-10 DIAGNOSIS — F418 Other specified anxiety disorders: Secondary | ICD-10-CM

## 2023-07-21 ENCOUNTER — Other Ambulatory Visit: Payer: Self-pay | Admitting: Family Medicine

## 2023-07-21 DIAGNOSIS — F418 Other specified anxiety disorders: Secondary | ICD-10-CM

## 2023-08-17 ENCOUNTER — Other Ambulatory Visit: Payer: Self-pay | Admitting: Family Medicine

## 2023-08-17 DIAGNOSIS — F418 Other specified anxiety disorders: Secondary | ICD-10-CM

## 2023-09-13 ENCOUNTER — Telehealth: Payer: Self-pay

## 2023-09-13 ENCOUNTER — Other Ambulatory Visit: Payer: Self-pay | Admitting: Family Medicine

## 2023-09-13 NOTE — Telephone Encounter (Signed)
Copied from CRM [id number]. Topic: Clinical - Medication Refill >> Sep 13, 2023  8:29 AM Maxwell Marion wrote: Most Recent Primary Care Visit:  Provider: Vickii Chafe  Department: [facility]  Visit Type: NURSE VISIT  Date: 04/26/2023  Medication: zolpidem (AMBIEN) 10 MG tablet  Has the patient contacted their pharmacy? Yes, pharmacy said they have reached out 3 days for refill (Agent: If no, request that the patient contact the pharmacy for the refill. If patient does not wish to contact the pharmacy document the reason why and proceed with request.) (Agent: If yes, when and what did the pharmacy advise?)  Is this the correct pharmacy for this prescription?  If no, delete pharmacy and type the correct one.  This is the patient's preferred pharmacy:   [facility] [address] [place], [place] [address]   Has the prescription been filled recently? No  Is the patient out of the medication?   Has the patient been seen for an appointment in the last year OR does the patient have an upcoming appointment?   Can we respond through MyChart?   Agent: Please be advised that Rx refills may take up to 3 business days. We ask that you follow-up with your pharmacy.

## 2023-09-13 NOTE — Telephone Encounter (Signed)
Copied from CRM [id number]. Topic: Clinical - Medication Refill >> Sep 13, 2023  4:49 PM Suzette B wrote: Reason for CRM: Patient has called in regard to her zolpidem (AMBIEN) 10 MG tablet patient stated she has been out since the weekend and she's not slept and really needs it so she doesn't get sick.  I have advised the patient that it does take up to 3 business days not including the weekend to complete the refill she stated the drug store is saying they've faxed it to the office over three times with no response, patient is requesting a call back as soon as someone can

## 2023-09-13 NOTE — Telephone Encounter (Signed)
Pt LOV was 04/26/2023 Please advise

## 2023-09-13 NOTE — Telephone Encounter (Signed)
Pt message sent to Dr Fry for advise 

## 2023-09-14 ENCOUNTER — Telehealth: Payer: Self-pay

## 2023-09-14 NOTE — Telephone Encounter (Signed)
Copied from CRM [id number]. Topic: Clinical - Prescription Issue >> Sep 14, 2023 11:39 AM Theodis Sato wrote: Reason for CRM: Please send a new prescription of  zolpidem (AMBIEN) 10 MG tablet as PT has been out over a week and is an expired prescription. [facility] to  [id number] [place] [facility], [place] - [address] [place] [address] Phone: [phone] Fax: [phone]

## 2023-09-16 MED ORDER — ZOLPIDEM TARTRATE 10 MG PO TABS: 10.0000 mg | ORAL_TABLET | Freq: Every day | ORAL | 5 refills | Status: DC

## 2023-09-16 NOTE — Telephone Encounter (Signed)
Done

## 2023-09-16 NOTE — Addendum Note (Signed)
Addended by: Gershon Crane A on: 09/16/2023 10:14 AM   Modules accepted: Orders

## 2023-09-16 NOTE — Telephone Encounter (Signed)
Pt LOV was on 04/26/23 Pt last refill done on 03/01/23 Please advise

## 2023-09-18 ENCOUNTER — Other Ambulatory Visit: Payer: Self-pay | Admitting: Family Medicine

## 2023-09-18 DIAGNOSIS — F418 Other specified anxiety disorders: Secondary | ICD-10-CM

## 2023-09-29 DIAGNOSIS — Z01419 Encounter for gynecological examination (general) (routine) without abnormal findings: Secondary | ICD-10-CM | POA: Diagnosis not present

## 2023-09-29 DIAGNOSIS — Z1331 Encounter for screening for depression: Secondary | ICD-10-CM | POA: Diagnosis not present

## 2023-09-30 ENCOUNTER — Ambulatory Visit: Payer: Self-pay | Admitting: Family Medicine

## 2023-09-30 DIAGNOSIS — F418 Other specified anxiety disorders: Secondary | ICD-10-CM

## 2023-09-30 NOTE — Telephone Encounter (Signed)
 Copied from CRM [id number]. Topic: Clinical - Medication Refill >> Sep 30, 2023 11:32 AM Merlynn LABOR wrote: Most Recent Primary Care Visit:  Provider: MOYUN, KARPUIH  Department: [facility]  Visit Type: NURSE VISIT  Date: 04/26/2023  Medication: LOMOTIL   Has the patient contacted their pharmacy? Yes (Agent: If no, request that the patient contact the pharmacy for the refill. If patient does not wish to contact the pharmacy document the reason why and proceed with request.) (Agent: If yes, when and what did the pharmacy advise?)  Patient switched pharmacies and would like refill to be sent to this location below. Please contact patient with any additional questions as this medication is not listed on medication list. Patient was unsure of MG for medication.   Is this the correct pharmacy for this prescription? Yes If no, delete pharmacy and type the correct one.  This is the patient's preferred pharmacy:   [phone] [place], [place] - [address] [place] [address] Phone: [phone] Fax: [phone]   Has the prescription been filled recently? No  Is the patient out of the medication? No  Has the patient been seen for an appointment in the last year OR does the patient have an upcoming appointment? Yes  Can we respond through MyChart? No  Agent: Please be advised that Rx refills may take up to 3 business days. We ask that you follow-up with your pharmacy.   Chief Complaint: Medication Refill Symptoms: Stomach Issues, without Rx Pertinent Negatives: Patient denies symptoms at this time Disposition: [] ED /[] Urgent Care (no appt availability in office) / [] Appointment(In office/virtual)/ []  [place]/ [] Home Care/ [] Refused Recommended Disposition /[] [place] [facility]/ []  Follow-up with PCP Additional Notes: DR is being triaged today regarding a medication refill. The patient is requesting a refill for LOMOTIL . The  patient had to change pharmacies due to insurance purposes.   Please contact patient when refill is addressed.    Reason for Disposition  [1] Prescription refill request for ESSENTIAL medicine (i.e., likelihood of harm to patient if not taken) AND [2] triager unable to refill per department policy  Answer Assessment - Initial Assessment Questions 1. DRUG NAME: What medicine do you need to have refilled?     Lomotil   2. REFILLS REMAINING: How many refills are remaining? (Note: The label on the medicine or pill bottle will show how many refills are remaining. If there are no refills remaining, then a renewal may be needed.)   4  3. EXPIRATION DATE: What is the expiration date? (Note: The label states when the prescription will expire, and thus can no longer be refilled.)     08-28-2023  4. PRESCRIBING HCP: Who prescribed it? Reason: If prescribed by specialist, call should be referred to that group.     Dr. Garnette Olmsted   5. SYMPTOMS: Do you have any symptoms?     Stomach Issues without Rx  6. PREGNANCY: Is there any chance that you are pregnant? When was your last menstrual period?     No  Protocols used: Medication Refill and Renewal Call-A-AH

## 2023-10-01 MED ORDER — DIPHENOXYLATE-ATROPINE 2.5-0.025 MG PO TABS: 2.0000 | ORAL_TABLET | Freq: Four times a day (QID) | ORAL | 5 refills | Status: DC | PRN

## 2023-10-01 NOTE — Telephone Encounter (Signed)
 I sent in refills.

## 2023-10-18 ENCOUNTER — Other Ambulatory Visit: Payer: Self-pay | Admitting: Family Medicine

## 2023-10-18 DIAGNOSIS — F418 Other specified anxiety disorders: Secondary | ICD-10-CM

## 2023-10-21 ENCOUNTER — Other Ambulatory Visit: Payer: Self-pay | Admitting: Family Medicine

## 2023-10-22 NOTE — Telephone Encounter (Signed)
Pt Last VV was on 04/26/23 Please advise

## 2023-10-25 ENCOUNTER — Other Ambulatory Visit: Payer: Self-pay | Admitting: Obstetrics

## 2023-10-25 DIAGNOSIS — Z1231 Encounter for screening mammogram for malignant neoplasm of breast: Secondary | ICD-10-CM

## 2023-11-09 DIAGNOSIS — L905 Scar conditions and fibrosis of skin: Secondary | ICD-10-CM | POA: Diagnosis not present

## 2023-11-09 DIAGNOSIS — L821 Other seborrheic keratosis: Secondary | ICD-10-CM | POA: Diagnosis not present

## 2023-11-09 DIAGNOSIS — Z85820 Personal history of malignant melanoma of skin: Secondary | ICD-10-CM | POA: Diagnosis not present

## 2023-11-09 DIAGNOSIS — L570 Actinic keratosis: Secondary | ICD-10-CM | POA: Diagnosis not present

## 2023-11-09 DIAGNOSIS — Z85828 Personal history of other malignant neoplasm of skin: Secondary | ICD-10-CM | POA: Diagnosis not present

## 2023-11-09 DIAGNOSIS — C44519 Basal cell carcinoma of skin of other part of trunk: Secondary | ICD-10-CM | POA: Diagnosis not present

## 2023-11-11 ENCOUNTER — Ambulatory Visit: Payer: No Typology Code available for payment source

## 2023-11-20 ENCOUNTER — Other Ambulatory Visit: Payer: Self-pay | Admitting: Family Medicine

## 2023-11-20 DIAGNOSIS — F418 Other specified anxiety disorders: Secondary | ICD-10-CM

## 2023-11-22 ENCOUNTER — Other Ambulatory Visit: Payer: Self-pay | Admitting: Family Medicine

## 2023-11-22 DIAGNOSIS — F418 Other specified anxiety disorders: Secondary | ICD-10-CM

## 2023-11-22 NOTE — Telephone Encounter (Signed)
 Copied from CRM [id number]. Topic: Clinical - Medication Refill >> Nov 22, 2023  9:44 AM Turkey A wrote: Most Recent Primary Care Visit:  Provider: Vickii Chafe  Department: [facility]  Visit Type: NURSE VISIT  Date: 04/26/2023  Medication: escitalopram (LEXAPRO) 20 MG tablet  Has the patient contacted their pharmacy? Yes (Agent: If no, request that the patient contact the pharmacy for the refill. If patient does not wish to contact the pharmacy document the reason why and proceed with request.) (Agent: If yes, when and what did the pharmacy advise?) [facility] but have not heard anything back  Is this the correct pharmacy for this prescription? Yes If no, delete pharmacy and type the correct one.  This is the patient's preferred pharmacy:  [facility] [place], [place] - [address] [facility] [address] [place] [address] Phone: [phone] Fax: [phone]  [facility] - [place], [place] - [address] [place] [address] Phone: [phone] Fax: [phone]  [id number] [address], [place] - [address] [place] [address] Phone: [phone] Fax: [phone]   Has the prescription been filled recently? No  Is the patient out of the medication? Yes  Has the patient been seen for an appointment in the last year OR does the patient have an upcoming appointment? Yes  Can we respond through MyChart? Yes  Agent: Please be advised that Rx refills may take up to 3 business days. We ask that you follow-up with your pharmacy.

## 2023-11-25 ENCOUNTER — Ambulatory Visit
Admission: RE | Admit: 2023-11-25 | Discharge: 2023-11-25 | Disposition: A | Discharge status: Home / Self Care | Payer: No Typology Code available for payment source | Source: Ambulatory Visit | Attending: Obstetrics | Admitting: Obstetrics

## 2023-11-25 DIAGNOSIS — Z1231 Encounter for screening mammogram for malignant neoplasm of breast: Secondary | ICD-10-CM

## 2023-12-13 ENCOUNTER — Other Ambulatory Visit: Payer: Self-pay | Admitting: Family Medicine

## 2023-12-13 MED ORDER — ZOLPIDEM TARTRATE 10 MG PO TABS: 10.0000 mg | ORAL_TABLET | Freq: Every day | ORAL | 5 refills | Status: DC

## 2023-12-13 NOTE — Telephone Encounter (Signed)
 Copied from CRM [id number]. Topic: Clinical - Medication Refill >> Dec 13, 2023  8:56 AM Truddie Crumble wrote: Most Recent Primary Care Visit:  Provider: Vickii Chafe  Department: [facility]  Visit Type: NURSE VISIT  Date: 04/26/2023  Medication: zolpidem (AMBIEN) 10 MG tablet  Has the patient contacted their pharmacy? Yes (Agent: If no, request that the patient contact the pharmacy for the refill. If patient does not wish to contact the pharmacy document the reason why and proceed with request.) (Agent: If yes, when and what did the pharmacy advise?)  Is this the correct pharmacy for this prescription? Yes If no, delete pharmacy and type the correct one.  This is the patient's preferred pharmacy:   [phone] [place], [place] - [address] [place] [address] Phone: [phone] Fax: [phone]   Has the prescription been filled recently? No  Is the patient out of the medication? Yes  Has the patient been seen for an appointment in the last year OR does the patient have an upcoming appointment? Yes  Can we respond through MyChart? Yes  Agent: Please be advised that Rx refills may take up to 3 business days. We ask that you follow-up with your pharmacy.

## 2023-12-13 NOTE — Telephone Encounter (Signed)
 Pt VV was on 04/26/23 Last refill was done on 09/16/23 Please advise

## 2023-12-15 ENCOUNTER — Telehealth: Payer: Self-pay

## 2023-12-15 NOTE — Telephone Encounter (Signed)
 Copied from CRM [id number]. Topic: Clinical - Prescription Issue >> Dec 15, 2023  8:14 AM Martinique E wrote: Reason for CRM: Patient called in stating that her prescription for Zolpidem (Ambien)  went to the wrong pharmacy. [facility] [address] [facility] [place]. Script was accidentally sent to the [facility]. Patient is leaving out of town this Friday so is hoping it is ready by then.

## 2023-12-16 ENCOUNTER — Telehealth: Payer: Self-pay | Admitting: *Deleted

## 2023-12-16 NOTE — Telephone Encounter (Signed)
 Copied from CRM [id number]. Topic: Clinical - Prescription Issue >> Dec 16, 2023  8:01 AM Alcus Dad wrote: Reason for CRM: Patient called back checking the status of pharmacy change. Patient is wanting to have medication zolpidem (AMBIEN) 10 MG tablet [facility]   [phone], [place] - [address] [place] [address] Phone: [phone] Fax: [phone]

## 2023-12-20 ENCOUNTER — Other Ambulatory Visit: Payer: Self-pay | Admitting: Family Medicine

## 2023-12-20 MED ORDER — ZOLPIDEM TARTRATE 10 MG PO TABS: 10.0000 mg | ORAL_TABLET | Freq: Every day | ORAL | 5 refills | Status: DC

## 2023-12-20 NOTE — Telephone Encounter (Signed)
 Copied from CRM [id number]. Topic: Clinical - Medication Refill >> Dec 20, 2023  8:47 AM Florestine Avers wrote: Most Recent Primary Care Visit:  Provider: Vickii Chafe  Department: [facility]  Visit Type: NURSE VISIT  Date: 04/26/2023  Medication: ALPRAZolam (XANAX) 0.5 MG tablet, zolpidem (AMBIEN) 10 MG tablet  Has the patient contacted their pharmacy? Yes (Agent: If no, request that the patient contact the pharmacy for the refill. If patient does not wish to contact the pharmacy document the reason why and proceed with request.) (Agent: If yes, when and what did the pharmacy advise?)  Is this the correct pharmacy for this prescription? Yes If no, delete pharmacy and type the correct one.  This is the patient's preferred pharmacy:  [phone] [place] [place], [place] - [address] [place] [address] Phone: [phone] Fax: [phone]  Has the prescription been filled recently? No  Is the patient out of the medication? Yes  Has the patient been seen for an appointment in the last year OR does the patient have an upcoming appointment? Yes  Can we respond through MyChart? Yes  Agent: Please be advised that Rx refills may take up to 3 business days. We ask that you follow-up with your pharmacy.

## 2023-12-20 NOTE — Telephone Encounter (Signed)
 Cancelled reorder on [facility]ax [facility] and confirmed with staff that patient picked up her Xanax on 11/29/23, it is too soon for it to be filled and she has refills on file. [facility] not [facility] to [facility] Ambien prescription from [facility] pharmacy due to it being a new, controlled prescription and they states they will need a new order sent to them.

## 2023-12-20 NOTE — Telephone Encounter (Signed)
 Done

## 2023-12-21 ENCOUNTER — Other Ambulatory Visit: Payer: Self-pay | Admitting: Family Medicine

## 2023-12-21 DIAGNOSIS — F418 Other specified anxiety disorders: Secondary | ICD-10-CM

## 2023-12-22 NOTE — Telephone Encounter (Signed)
 Already done by Dr Clent Ridges

## 2024-01-10 ENCOUNTER — Other Ambulatory Visit: Payer: Self-pay | Admitting: Family Medicine

## 2024-01-10 DIAGNOSIS — F418 Other specified anxiety disorders: Secondary | ICD-10-CM

## 2024-01-18 ENCOUNTER — Other Ambulatory Visit: Payer: Self-pay | Admitting: Family Medicine

## 2024-01-18 DIAGNOSIS — F418 Other specified anxiety disorders: Secondary | ICD-10-CM

## 2024-01-24 ENCOUNTER — Encounter: Payer: Self-pay | Admitting: Family Medicine

## 2024-01-24 ENCOUNTER — Ambulatory Visit: Payer: Self-pay

## 2024-01-24 ENCOUNTER — Ambulatory Visit: Admitting: Family Medicine

## 2024-01-24 VITALS — BP 108/72 | Temp 98.6°F | Wt 96.6 lb

## 2024-01-24 DIAGNOSIS — J029 Acute pharyngitis, unspecified: Secondary | ICD-10-CM | POA: Diagnosis not present

## 2024-01-24 DIAGNOSIS — R0989 Other specified symptoms and signs involving the circulatory and respiratory systems: Secondary | ICD-10-CM

## 2024-01-24 DIAGNOSIS — J020 Streptococcal pharyngitis: Secondary | ICD-10-CM | POA: Diagnosis not present

## 2024-01-24 LAB — POC COVID19 BINAXNOW: SARS Coronavirus 2 Ag: NEGATIVE

## 2024-01-24 LAB — POCT RAPID STREP A (OFFICE): Rapid Strep A Screen: POSITIVE — AB

## 2024-01-24 MED ORDER — CEFUROXIME AXETIL 500 MG PO TABS: 500.0000 mg | ORAL_TABLET | Freq: Two times a day (BID) | ORAL | 0 refills | Status: DC

## 2024-01-24 NOTE — Telephone Encounter (Signed)
 Copied from CRM [id number]. Topic: Clinical - Red Word Triage >> Jan 24, 2024  8:36 AM Orien Bird wrote: [facility] that prompted transfer to Nurse Triage: Short of breath and sore throat, patient think she has covid  Chief Complaint: covid exposure Symptoms: cough, soreness, headache,  Frequency: started Friday Pertinent Negatives: Patient denies fever Disposition: [] ED /[] Urgent Care (no appt availability in office) / [x] Appointment(In office/virtual)/ []  [place] [facility]/ [] Home Care/ [] Refused Recommended Disposition /[]  Mobile Bus/ []  Follow-up with PCP Additional Notes: per protocol apt made for today; care advice given, denies questions; instructed to go to ER if becomes worse.   Reason for Disposition  MILD difficulty breathing (e.g., minimal/no SOB at rest, SOB with walking, pulse <100)  Answer Assessment - Initial Assessment Questions 1. COVID-19 DIAGNOSIS: "How do you know that you have COVID?" (e.g., positive lab test or self-test, diagnosed by doctor or NP/PA, symptoms after exposure).     exposure 2. COVID-19 EXPOSURE: "Was there any known exposure to COVID before the symptoms began?" CDC Definition of close contact: within 6 feet (2 meters) for a total of 15 minutes or more over a 24-hour period.      All weekend 3. ONSET: "When did the COVID-19 symptoms start?"      Friday 4. WORST SYMPTOM: "What is your worst symptom?" (e.g., cough, fever, shortness of breath, muscle aches)     Sore throat, sob, exhausted,  5. COUGH: "Do you have a cough?" If Yes, ask: "How bad is the cough?"       yes 6. FEVER: "Do you have a fever?" If Yes, ask: "What is your temperature, how was it measured, and when did it start?"     no 7. RESPIRATORY STATUS: "Describe your breathing?" (e.g., normal; shortness of breath, wheezing, unable to speak)      sob 8. BETTER-SAME-WORSE: "Are you getting better, staying the same or getting worse compared to yesterday?"  If getting worse, ask, "In  what way?"     worse 9. OTHER SYMPTOMS: "Do you have any other symptoms?"  (e.g., chills, fatigue, headache, loss of smell or taste, muscle pain, sore throat)     Sore throat, headache, sore 10. HIGH RISK DISEASE: "Do you have any chronic medical problems?" (e.g., asthma, heart or lung disease, weak immune system, obesity, etc.)       denies 11. VACCINE: "Have you had the COVID-19 vaccine?" If Yes, ask: "Which one, how many shots, when did you get it?"       no 12. PREGNANCY: "Is there any chance you are pregnant?" "When was your last menstrual period?"       na 13. O2 SATURATION MONITOR:  "Do you use an oxygen saturation monitor (pulse oximeter) at home?" If Yes, ask "What is your reading (oxygen level) today?" "What is your usual oxygen saturation reading?" (e.g., 95%)       no  Protocols used: Coronavirus (COVID-19) Diagnosed or Suspected-A-AH

## 2024-01-24 NOTE — Progress Notes (Signed)
   Subjective:    Patient ID: Sierra Peters, female    DOB: 09-Feb-1961, 63 y.o.   MRN: [id number]  HPI Here for 5 days of ST, body aches, and a dry cough. No fever. Drinking fluids.    Review of Systems  Constitutional:  Positive for fatigue. Negative for fever.  HENT:  Positive for sore throat and voice change. Negative for congestion, ear pain, postnasal drip and sinus pressure.   Eyes: Negative.   Respiratory:  Positive for cough. Negative for shortness of breath and wheezing.        Objective:   Physical Exam Constitutional:      Appearance: Normal appearance.  HENT:     Right Ear: Tympanic membrane, ear canal and external ear normal.     Left Ear: Tympanic membrane, ear canal and external ear normal.     Nose: Nose normal.     Mouth/Throat:     Pharynx: Oropharynx is clear.  Eyes:     Conjunctiva/sclera: Conjunctivae normal.  Pulmonary:     Effort: Pulmonary effort is normal.     Breath sounds: Normal breath sounds.  Lymphadenopathy:     Cervical: Cervical adenopathy present.  Neurological:     Mental Status: She is alert.           Assessment & Plan:  Strep pharyngitis, treat with 10 days of Cefuroxime.  Corita Diego, MD

## 2024-01-25 NOTE — Telephone Encounter (Signed)
 Pt was seen yesterday by Dr Alyne Babinski regarding this problem

## 2024-01-28 ENCOUNTER — Other Ambulatory Visit: Payer: Self-pay

## 2024-01-28 ENCOUNTER — Telehealth: Payer: Self-pay

## 2024-01-28 ENCOUNTER — Ambulatory Visit: Payer: Self-pay

## 2024-01-28 MED ORDER — METHYLPREDNISOLONE 4 MG PO TBPK: ORAL_TABLET | ORAL | 0 refills | Status: DC

## 2024-01-28 MED ORDER — AMOXICILLIN-POT CLAVULANATE 875-125 MG PO TABS: 1.0000 | ORAL_TABLET | Freq: Two times a day (BID) | ORAL | 0 refills | Status: DC

## 2024-01-28 NOTE — Telephone Encounter (Signed)
 I had asked you to call in Augmentin in an earlier message. Please do NOT do so. Instead we will fax in an order for a Medrol  dose pack

## 2024-01-28 NOTE — Telephone Encounter (Signed)
 Copied from CRM [id number]. Topic: Clinical - Medical Advice >> Jan 28, 2024  8:03 AM Dimple Francis wrote: Reason for CRM: Patient calling to speak to Nurse Haskell Linker, asking to get another medication as she is still not feeling well.

## 2024-01-28 NOTE — Telephone Encounter (Signed)
 Cancelled Rx for Augmentin with pt pharmacy, pt notified

## 2024-01-28 NOTE — Telephone Encounter (Signed)
 Stop Cefuroxime  and call in Augmentin 875 BID for 10 days

## 2024-01-28 NOTE — Telephone Encounter (Signed)
 Copied from CRM [id number]. Topic: Clinical - Red Word Triage >> Jan 28, 2024 10:02 AM Chuck Crater wrote: Red [facility] that prompted transfer to Nurse Triage: Patient was seen Monday and is still not feeling well. She is still congested, coughing, wheezing, and shortness of breath. Patient was on antibiotic but wants Prednisone to kick it out/ doesn't want to be sick over the weekend.   Chief Complaint: Seen Monday and started on antibiotic. Feels a little better, but still coughing and has wheezing. Asking for Prednisone. Symptoms: Above Frequency: This week Pertinent Negatives: Patient denies fever Disposition: [] ED /[] Urgent Care (no appt availability in office) / [] Appointment(In office/virtual)/ []  [place] [facility]/ [] Home Care/ [] Refused Recommended Disposition /[] [place] [facility]/ [x]  Follow-up with PCP Additional Notes: Please advise pt.  Reason for Disposition  [1] Continuous (nonstop) coughing interferes with work or school AND [2] no improvement using cough treatment per Care Advice  Answer Assessment - Initial Assessment Questions 1. ONSET: "When did the cough begin?"      This week 2. SEVERITY: "How bad is the cough today?"      Moderate 3. SPUTUM: "Describe the color of your sputum" (none, dry cough; clear, white, yellow, green)     None 4. HEMOPTYSIS: "Are you coughing up any blood?" If so ask: "How much?" (flecks, streaks, tablespoons, etc.)     No 5. DIFFICULTY BREATHING: "Are you having difficulty breathing?" If Yes, ask: "How bad is it?" (e.g., mild, moderate, severe)    - MILD: No SOB at rest, mild SOB with walking, speaks normally in sentences, can lie down, no retractions, pulse < 100.    - MODERATE: SOB at rest, SOB with minimal exertion and prefers to sit, cannot lie down flat, speaks in phrases, mild retractions, audible wheezing, pulse 100-120.    - SEVERE: Very SOB at rest, speaks in single words, struggling to breathe, sitting hunched forward, retractions,  pulse > 120      No 6. FEVER: "Do you have a fever?" If Yes, ask: "What is your temperature, how was it measured, and when did it start?"     No 7. CARDIAC HISTORY: "Do you have any history of heart disease?" (e.g., heart attack, congestive heart failure)      No 8. LUNG HISTORY: "Do you have any history of lung disease?"  (e.g., pulmonary embolus, asthma, emphysema)     No 9. PE RISK FACTORS: "Do you have a history of blood clots?" (or: recent major surgery, recent prolonged travel, bedridden)     No 10. OTHER SYMPTOMS: "Do you have any other symptoms?" (e.g., runny nose, wheezing, chest pain)       Wheezing 11. PREGNANCY: "Is there any chance you are pregnant?" "When was your last menstrual period?"       No 12. TRAVEL: "Have you traveled out of the country in the last month?" (e.g., travel history, exposures)       No  Protocols used: Cough - Acute Productive-A-AH

## 2024-01-28 NOTE — Telephone Encounter (Signed)
 Pt Rx sent to her pharmacy, Rx for Prednisone was faxed to pt pharmacy. Pt has been notified

## 2024-02-15 ENCOUNTER — Other Ambulatory Visit: Payer: Self-pay | Admitting: Family Medicine

## 2024-02-15 DIAGNOSIS — F418 Other specified anxiety disorders: Secondary | ICD-10-CM

## 2024-02-16 ENCOUNTER — Encounter: Payer: Self-pay | Admitting: Family Medicine

## 2024-02-16 ENCOUNTER — Ambulatory Visit: Payer: Self-pay | Admitting: Family Medicine

## 2024-02-16 ENCOUNTER — Ambulatory Visit (INDEPENDENT_AMBULATORY_CARE_PROVIDER_SITE_OTHER): Admitting: Family Medicine

## 2024-02-16 VITALS — BP 100/76 | HR 66 | Temp 98.2°F | Wt 99.0 lb

## 2024-02-16 DIAGNOSIS — E559 Vitamin D deficiency, unspecified: Secondary | ICD-10-CM

## 2024-02-16 DIAGNOSIS — E538 Deficiency of other specified B group vitamins: Secondary | ICD-10-CM

## 2024-02-16 DIAGNOSIS — M810 Age-related osteoporosis without current pathological fracture: Secondary | ICD-10-CM | POA: Diagnosis not present

## 2024-02-16 DIAGNOSIS — G4733 Obstructive sleep apnea (adult) (pediatric): Secondary | ICD-10-CM

## 2024-02-16 DIAGNOSIS — Z0000 Encounter for general adult medical examination without abnormal findings: Secondary | ICD-10-CM

## 2024-02-16 DIAGNOSIS — M150 Primary generalized (osteo)arthritis: Secondary | ICD-10-CM

## 2024-02-16 DIAGNOSIS — M2550 Pain in unspecified joint: Secondary | ICD-10-CM

## 2024-02-16 LAB — HEPATIC FUNCTION PANEL
ALT: 13 U/L (ref 0–35)
AST: 14 U/L (ref 0–37)
Albumin: 4.2 g/dL (ref 3.5–5.2)
Alkaline Phosphatase: 24 U/L — ABNORMAL LOW (ref 39–117)
Bilirubin, Direct: 0.1 mg/dL (ref 0.0–0.3)
Total Bilirubin: 0.5 mg/dL (ref 0.2–1.2)
Total Protein: 6.5 g/dL (ref 6.0–8.3)

## 2024-02-16 LAB — LIPID PANEL
Cholesterol: 179 mg/dL (ref 0–200)
HDL: 65.2 mg/dL (ref >39.00)
LDL Cholesterol: 102 mg/dL — ABNORMAL HIGH (ref 0–99)
NonHDL: 113.8
Total CHOL/HDL Ratio: 3
Triglycerides: 57 mg/dL (ref 0.0–149.0)
VLDL: 11.4 mg/dL (ref 0.0–40.0)

## 2024-02-16 LAB — BASIC METABOLIC PANEL WITH GFR
BUN: 24 mg/dL — ABNORMAL HIGH (ref 6–23)
CO2: 30 meq/L (ref 19–32)
Calcium: 9.6 mg/dL (ref 8.4–10.5)
Chloride: 103 meq/L (ref 96–112)
Creatinine, Ser: 0.92 mg/dL (ref 0.40–1.20)
GFR: 67.39 mL/min (ref >60.00)
Glucose, Bld: 92 mg/dL (ref 70–99)
Potassium: 4.2 meq/L (ref 3.5–5.1)
Sodium: 140 meq/L (ref 135–145)

## 2024-02-16 LAB — CBC WITH DIFFERENTIAL/PLATELET
Basophils Absolute: 0 10*3/uL (ref 0.0–0.1)
Basophils Relative: 0.8 % (ref 0.0–3.0)
Eosinophils Absolute: 0.1 10*3/uL (ref 0.0–0.7)
Eosinophils Relative: 3.4 % (ref 0.0–5.0)
HCT: 41.3 % (ref 36.0–46.0)
Hemoglobin: 13.8 g/dL (ref 12.0–15.0)
Lymphocytes Relative: 23.6 % (ref 12.0–46.0)
Lymphs Abs: 0.9 10*3/uL (ref 0.7–4.0)
MCHC: 33.3 g/dL (ref 30.0–36.0)
MCV: 95 fl (ref 78.0–100.0)
Monocytes Absolute: 0.3 10*3/uL (ref 0.1–1.0)
Monocytes Relative: 6.8 % (ref 3.0–12.0)
Neutro Abs: 2.4 10*3/uL (ref 1.4–7.7)
Neutrophils Relative %: 65.4 % (ref 43.0–77.0)
Platelets: 258 10*3/uL (ref 150.0–400.0)
RBC: 4.35 Mil/uL (ref 3.87–5.11)
RDW: 13.6 % (ref 11.5–15.5)
WBC: 3.7 10*3/uL — ABNORMAL LOW (ref 4.0–10.5)

## 2024-02-16 LAB — C-REACTIVE PROTEIN: CRP: <1 mg/dL (ref 0.5–20.0)

## 2024-02-16 LAB — VITAMIN D 25 HYDROXY (VIT D DEFICIENCY, FRACTURES): VITD: 29.07 ng/mL — ABNORMAL LOW (ref 30.00–100.00)

## 2024-02-16 LAB — VITAMIN B12: Vitamin B-12: 585 pg/mL (ref 211–911)

## 2024-02-16 LAB — SEDIMENTATION RATE: Sed Rate: 4 mm/h (ref 0–30)

## 2024-02-16 LAB — TSH: TSH: 0.84 u[IU]/mL (ref 0.35–5.50)

## 2024-02-16 LAB — HEMOGLOBIN A1C: Hgb A1c MFr Bld: 6.2 % (ref 4.6–6.5)

## 2024-02-16 NOTE — Progress Notes (Signed)
 Subjective:    Patient ID: Sierra Peters, female    DOB: December 30, 1960, 63 y.o.   MRN: [id number]  HPI Here for a well exam. She has a few issues to discuss. First her OA has been getting worse, and she has pain in most of her joints every day. Taking Meloxicam  helps a little. She asks for a referral to Rheumatology. She also complains of constant fatigue and sleepiness. She was diagnosed with sleep apnea years ago, and a CPAP machine was recommended. She refused to try this and she never went back to them for follow up. She would like a second opinion. She was due for a colonoscopy last year with Dr. Lavaughn Portland, but she did not hear from them.    Review of Systems  Constitutional:  Positive for fatigue.  HENT: Negative.    Eyes: Negative.   Respiratory: Negative.    Cardiovascular: Negative.   Gastrointestinal: Negative.   Genitourinary:  Negative for decreased urine volume, difficulty urinating, dyspareunia, dysuria, enuresis, flank pain, frequency, hematuria, pelvic pain and urgency.  Musculoskeletal:  Positive for arthralgias.  Skin: Negative.   Neurological: Negative.  Negative for headaches.  Psychiatric/Behavioral: Negative.         Objective:   Physical Exam Constitutional:      General: She is not in acute distress.    Appearance: Normal appearance. She is well-developed.  HENT:     Head: Normocephalic and atraumatic.     Right Ear: External ear normal.     Left Ear: External ear normal.     Nose: Nose normal.     Mouth/Throat:     Pharynx: No oropharyngeal exudate.  Eyes:     General: No scleral icterus.    Conjunctiva/sclera: Conjunctivae normal.     Pupils: Pupils are equal, round, and reactive to light.  Neck:     Thyroid : No thyromegaly.     Vascular: No JVD.  Cardiovascular:     Rate and Rhythm: Normal rate and regular rhythm.     Pulses: Normal pulses.     Heart sounds: Normal heart sounds. No murmur heard.    No friction rub. No gallop.  Pulmonary:      Effort: Pulmonary effort is normal. No respiratory distress.     Breath sounds: Normal breath sounds. No wheezing or rales.  Chest:     Chest wall: No tenderness.  Abdominal:     General: Bowel sounds are normal. There is no distension.     Palpations: Abdomen is soft. There is no mass.     Tenderness: There is no abdominal tenderness. There is no guarding or rebound.  Musculoskeletal:        General: No tenderness. Normal range of motion.     Cervical back: Normal range of motion and neck supple.  Lymphadenopathy:     Cervical: No cervical adenopathy.  Skin:    General: Skin is warm and dry.     Findings: No erythema or rash.  Neurological:     General: No focal deficit present.     Mental Status: She is alert and oriented to person, place, and time.     Cranial Nerves: No cranial nerve deficit.     Motor: No abnormal muscle tone.     Coordination: Coordination normal.     Deep Tendon Reflexes: Reflexes are normal and symmetric. Reflexes normal.  Psychiatric:        Mood and Affect: Mood normal.        Behavior: Behavior  normal.        Thought Content: Thought content normal.        Judgment: Judgment normal.           Assessment & Plan:  Well exam. We discussed diet and exercise. Get fasting labs. She will contact Dr. Lavaughn Portland for another colonoscopy. We will refer her to Rheumatology for the OA. We will set her up for her first DEXA. Refer to Pulmonology for sleep apnea.  Corita Diego, MD

## 2024-02-17 LAB — RHEUMATOID FACTOR: Rheumatoid fact SerPl-aCnc: <10 [IU]/mL (ref <14)

## 2024-02-29 ENCOUNTER — Telehealth: Payer: Self-pay | Admitting: Family Medicine

## 2024-02-29 ENCOUNTER — Telehealth: Payer: Self-pay

## 2024-02-29 NOTE — Telephone Encounter (Signed)
 Copied from CRM [id number]. Topic: Referral - Status >> Feb 29, 2024  9:28 AM Sierra Peters wrote: Reason for CRM pt called to follow up on the status of her bone density referral and pulmonalogist. Pt is request a call back with a update on both   At [phone]

## 2024-02-29 NOTE — Telephone Encounter (Signed)
 Called patient left message for patient to call office and ask to speak with Bernabe Brew to schedule her bone density.

## 2024-03-09 ENCOUNTER — Ambulatory Visit (INDEPENDENT_AMBULATORY_CARE_PROVIDER_SITE_OTHER)
Admission: RE | Admit: 2024-03-09 | Discharge: 2024-03-09 | Disposition: A | Discharge status: Home / Self Care | Source: Ambulatory Visit

## 2024-03-09 DIAGNOSIS — M810 Age-related osteoporosis without current pathological fracture: Secondary | ICD-10-CM

## 2024-03-10 ENCOUNTER — Encounter: Payer: Self-pay | Admitting: Family Medicine

## 2024-03-10 MED ORDER — ALENDRONATE SODIUM 35 MG PO TABS: 35.0000 mg | ORAL_TABLET | ORAL | 3 refills | Status: AC

## 2024-03-10 NOTE — Addendum Note (Signed)
 Addended by: Corita Diego A on: 03/10/2024 01:09 PM   Modules accepted: Orders

## 2024-03-13 NOTE — Telephone Encounter (Signed)
 Pt wants to know what the scores on her bone density means, pt declined a visit to review results with Dr Johnny. Pt states to ask Dr Johnny to call her to discuss this. Please advise

## 2024-03-13 NOTE — Telephone Encounter (Signed)
 Bone density is measured with T scores. Anything between 0 and -1.0 is considered normal. Between -1.0 and -2.5 is considered osteopenia, which is mildly thin bones but not too bad. I usually treat this with OTC calcium and vitamin D . Scores above -2.5 is considered osteoporosis, which means the bones are more seriously thin and at a higher rick of fractures. I treat this with medications like Alendronate  which actually help the bone cells build more bone. We normally use these medications for 5 years or so and then stop them.

## 2024-03-15 DIAGNOSIS — K219 Gastro-esophageal reflux disease without esophagitis: Secondary | ICD-10-CM | POA: Diagnosis not present

## 2024-03-15 DIAGNOSIS — K529 Noninfective gastroenteritis and colitis, unspecified: Secondary | ICD-10-CM | POA: Diagnosis not present

## 2024-04-07 ENCOUNTER — Encounter: Payer: Self-pay | Admitting: Advanced Practice Midwife

## 2024-04-12 DIAGNOSIS — M79641 Pain in right hand: Secondary | ICD-10-CM | POA: Diagnosis not present

## 2024-04-12 DIAGNOSIS — M25512 Pain in left shoulder: Secondary | ICD-10-CM | POA: Diagnosis not present

## 2024-04-12 DIAGNOSIS — M7989 Other specified soft tissue disorders: Secondary | ICD-10-CM | POA: Diagnosis not present

## 2024-04-12 DIAGNOSIS — M79642 Pain in left hand: Secondary | ICD-10-CM | POA: Diagnosis not present

## 2024-04-12 DIAGNOSIS — M154 Erosive (osteo)arthritis: Secondary | ICD-10-CM | POA: Diagnosis not present

## 2024-04-19 ENCOUNTER — Other Ambulatory Visit: Payer: Self-pay | Admitting: Family Medicine

## 2024-04-19 DIAGNOSIS — F418 Other specified anxiety disorders: Secondary | ICD-10-CM

## 2024-04-24 ENCOUNTER — Other Ambulatory Visit: Payer: Self-pay | Admitting: Family Medicine

## 2024-05-22 ENCOUNTER — Other Ambulatory Visit: Payer: Self-pay | Admitting: Family Medicine

## 2024-05-23 NOTE — Telephone Encounter (Signed)
 Pt LOV was on 02/16/24 Last refill was done on 10/22/23

## 2024-05-24 ENCOUNTER — Other Ambulatory Visit: Payer: Self-pay | Admitting: Family Medicine

## 2024-05-24 DIAGNOSIS — F418 Other specified anxiety disorders: Secondary | ICD-10-CM

## 2024-05-26 ENCOUNTER — Other Ambulatory Visit: Payer: Self-pay | Admitting: Family Medicine

## 2024-05-26 DIAGNOSIS — K219 Gastro-esophageal reflux disease without esophagitis: Secondary | ICD-10-CM | POA: Diagnosis not present

## 2024-05-26 DIAGNOSIS — K5730 Diverticulosis of large intestine without perforation or abscess without bleeding: Secondary | ICD-10-CM | POA: Diagnosis not present

## 2024-05-26 DIAGNOSIS — K649 Unspecified hemorrhoids: Secondary | ICD-10-CM | POA: Diagnosis not present

## 2024-05-26 DIAGNOSIS — M150 Primary generalized (osteo)arthritis: Secondary | ICD-10-CM

## 2024-05-26 DIAGNOSIS — Z1211 Encounter for screening for malignant neoplasm of colon: Secondary | ICD-10-CM | POA: Diagnosis not present

## 2024-05-26 DIAGNOSIS — K2970 Gastritis, unspecified, without bleeding: Secondary | ICD-10-CM | POA: Diagnosis not present

## 2024-05-26 DIAGNOSIS — K449 Diaphragmatic hernia without obstruction or gangrene: Secondary | ICD-10-CM | POA: Diagnosis not present

## 2024-05-26 LAB — HM COLONOSCOPY

## 2024-06-30 DIAGNOSIS — M791 Myalgia, unspecified site: Secondary | ICD-10-CM | POA: Diagnosis not present

## 2024-06-30 DIAGNOSIS — M545 Low back pain, unspecified: Secondary | ICD-10-CM | POA: Diagnosis not present

## 2024-07-17 ENCOUNTER — Other Ambulatory Visit: Payer: Self-pay | Admitting: Family Medicine

## 2024-07-17 DIAGNOSIS — F418 Other specified anxiety disorders: Secondary | ICD-10-CM

## 2024-07-20 DIAGNOSIS — M533 Sacrococcygeal disorders, not elsewhere classified: Secondary | ICD-10-CM | POA: Diagnosis not present

## 2024-08-13 ENCOUNTER — Other Ambulatory Visit: Payer: Self-pay | Admitting: Family Medicine

## 2024-08-16 DIAGNOSIS — M545 Low back pain, unspecified: Secondary | ICD-10-CM | POA: Diagnosis not present

## 2024-08-25 ENCOUNTER — Ambulatory Visit: Payer: Self-pay

## 2024-08-25 DIAGNOSIS — M545 Low back pain, unspecified: Secondary | ICD-10-CM | POA: Diagnosis not present

## 2024-08-25 DIAGNOSIS — M25512 Pain in left shoulder: Secondary | ICD-10-CM | POA: Diagnosis not present

## 2024-08-25 DIAGNOSIS — N281 Cyst of kidney, acquired: Secondary | ICD-10-CM | POA: Diagnosis not present

## 2024-08-25 DIAGNOSIS — K7689 Other specified diseases of liver: Secondary | ICD-10-CM | POA: Diagnosis not present

## 2024-08-25 NOTE — Telephone Encounter (Signed)
 FYI Only or Action Required?: FYI only for provider: appointment scheduled on 08/28/24.  Patient was last seen in primary care on 02/16/2024 by Johnny Garnette LABOR, MD.  Called Nurse Triage reporting Hypertension.  Symptoms began today.  Interventions attempted: Rest, hydration, or home remedies.  Symptoms are: stable.  Triage Disposition: See Within 3 Days in Office (overriding See PCP Within 2 Weeks)  Patient/caregiver understands and will follow disposition?: Yes                               1. BLOOD PRESSURE: What is your blood pressure? Did you take at least two measurements 5 minutes apart?     146/94 while on phone with this RN 180/104 today at orthopedic appointment, patient was advised to call PCP 2. ONSET: When did you take your blood pressure?     Today 3. HOW: How did you take your blood pressure? (e.g., automatic home BP monitor, visiting nurse)     Via home wrist cuff while on phone with this RN 4. HISTORY: Do you have a history of high blood pressure?     Denies 5. MEDICINES: Are you taking any medicines for blood pressure? Have you missed any doses recently?     Denies 6. OTHER SYMPTOMS: Do you have any symptoms? (e.g., blurred vision, chest pain, difficulty breathing, headache, weakness)     Nasal congestion, lightheadedness- states she believes this is related to sinus congestion, headaches- states she believes this is also related to sinus congestion Denies chest pain, denies difficulty breathing, denies numbness, denies weakness, denies blurred vision    This RN advised in-person evaluation. Patient has been scheduled for Monday morning, 08/28/24, with PCP. Care advice provided as documented.   Copied from CRM #[id number]. Topic: Clinical - Red Word Triage >> Aug 25, 2024  3:20 PM Robinson H wrote: [facility] that prompted transfer to Nurse Triage: Having issues with elevated blood pressure 180/104, 175/106, 160/94 today and the  last week, states her job is stressful, real light headed.  Reason for Disposition  [1] Systolic BP >= 130 OR Diastolic >= 80 AND [2] not taking BP medications  Protocols used: Blood Pressure - High-A-AH

## 2024-08-28 ENCOUNTER — Ambulatory Visit: Admitting: Family Medicine

## 2024-08-28 ENCOUNTER — Encounter: Payer: Self-pay | Admitting: Family Medicine

## 2024-08-28 ENCOUNTER — Ambulatory Visit: Payer: Self-pay

## 2024-08-28 VITALS — BP 126/92 | HR 81 | Temp 98.5°F | Ht 64.0 in | Wt 99.0 lb

## 2024-08-28 DIAGNOSIS — I10 Essential (primary) hypertension: Secondary | ICD-10-CM | POA: Diagnosis not present

## 2024-08-28 MED ORDER — ALPRAZOLAM 0.5 MG PO TABS: 0.5000 mg | ORAL_TABLET | Freq: Every day | ORAL | Status: AC

## 2024-08-28 MED ORDER — METOPROLOL SUCCINATE ER 50 MG PO TB24: 50.0000 mg | ORAL_TABLET | Freq: Every day | ORAL | 3 refills | Status: DC

## 2024-08-28 NOTE — Progress Notes (Signed)
   Subjective:    Patient ID: Sierra Peters, female    DOB: 1961-03-22, 63 y.o.   MRN: [id number]  HPI Here with concerns about her BP. For the past month she has had a number of high BP readings at her office and at her orthopedist's office (Dr. Duwayne). She has had readings as high as 180/104. No SOB or chest pains, but she admits to being under a lot of stress. Her job is stressful, and she says she can never relax even at home. She still takes Lexapro  and Lamictal , as well as a Xanax  at bedtime. She used to take Xanx TID but she weaned herself down. Her renal function is normal (creatinine in May was 0.92).    Review of Systems  Constitutional: Negative.   Respiratory: Negative.    Cardiovascular: Negative.   Neurological: Negative.   Psychiatric/Behavioral:  The patient is nervous/anxious.        Objective:   Physical Exam Constitutional:      Appearance: Normal appearance.  Cardiovascular:     Rate and Rhythm: Normal rate and regular rhythm.     Pulses: Normal pulses.     Heart sounds: Normal heart sounds.  Pulmonary:     Effort: Pulmonary effort is normal.     Breath sounds: Normal breath sounds.  Musculoskeletal:     Right lower leg: No edema.     Left lower leg: No edema.  Neurological:     General: No focal deficit present.     Mental Status: She is alert and oriented to person, place, and time.           Assessment & Plan:  HTN, we will treat this with Metoprolol  succinate 50 mg daily. I advised her to at least think about looking for a less stressful job. Recheck in 2 weeks.  Garnette Olmsted, MD

## 2024-08-28 NOTE — Telephone Encounter (Signed)
 Rescheduled from 8am today due to possible inclement weather Patient tried to call last night to reschedule   FYI Only or Action Required?: FYI only for provider: appointment scheduled on 08/28/2024 at 1:45pm with patient's PCP Dr Johnny.  Patient was last seen in primary care on 02/16/2024 by Johnny Garnette LABOR, MD.  Called Nurse Triage reporting Hypertension.  Symptoms began 2 months ago.  Interventions attempted: Nothing.  Symptoms are: unchanged.  Triage Disposition: See PCP Within 2 Weeks  Patient/caregiver understands and will follow disposition?: Yes            Copied from CRM [id number]. Topic: Clinical - Red Word Triage >> Aug 28, 2024  8:07 AM Amy B wrote: Red Word that prompted transfer to Nurse Triage: Severe headache every day Reason for Disposition . [1] Systolic BP >= 130 OR Diastolic >= 80 AND [2] not taking BP medications  Answer Assessment - Initial Assessment Questions Patient is calling this morning to reschedule her appt that was at 8am today but she had called to try and change it already due to possible inclement weather High blood pressure --taking it for the past week --saw ortho on Friday 08/25/2024 and bp there was 180/104 and was advised from her ortho provider to follow up with her PCP about getting put on medication Patient states that she is having stress as well with work She denies any injuries 140/81 heart rate is 92 during triage 142/79 at 8:23am during triage Automatic wrist cuff at home The only symptoms patient states  Patient added that she always feels bad and never has any energy.  She agrees to come in today at 1:45pm to see her PCP  Patient is advised to call us  back if anything changes or with any further questions/concerns. Patient is advised that if anything worsens to go to the Emergency Room. Patient verbalized understanding.    1. BLOOD PRESSURE: What is your blood pressure? Did you take at least two measurements 5 minutes  apart?     8:15am today during triage 140/81 3. HOW: How did you take your blood pressure? (e.g., automatic home BP monitor, visiting nurse)     Automatic wrist cuff at home  4. HISTORY: Do you have a history of high blood pressure?     No 5. MEDICINES: Are you taking any medicines for blood pressure? Have you missed any doses recently?     N/A 6. OTHER SYMPTOMS: Do you have any symptoms? (e.g., blurred vision, chest pain, difficulty breathing, headache, weakness)     Headaches every day  Protocols used: Blood Pressure - High-A-AH

## 2024-09-06 DIAGNOSIS — M5416 Radiculopathy, lumbar region: Secondary | ICD-10-CM | POA: Diagnosis not present

## 2024-09-10 DIAGNOSIS — M25512 Pain in left shoulder: Secondary | ICD-10-CM | POA: Diagnosis not present

## 2024-09-19 ENCOUNTER — Encounter: Payer: Self-pay | Admitting: Family Medicine

## 2024-09-19 ENCOUNTER — Telehealth (INDEPENDENT_AMBULATORY_CARE_PROVIDER_SITE_OTHER): Admitting: Family Medicine

## 2024-09-19 ENCOUNTER — Ambulatory Visit: Admitting: Family Medicine

## 2024-09-19 DIAGNOSIS — I10 Essential (primary) hypertension: Secondary | ICD-10-CM

## 2024-09-19 DIAGNOSIS — K7689 Other specified diseases of liver: Secondary | ICD-10-CM | POA: Diagnosis not present

## 2024-09-19 DIAGNOSIS — M25512 Pain in left shoulder: Secondary | ICD-10-CM | POA: Diagnosis not present

## 2024-09-19 DIAGNOSIS — M545 Low back pain, unspecified: Secondary | ICD-10-CM | POA: Diagnosis not present

## 2024-09-19 DIAGNOSIS — N281 Cyst of kidney, acquired: Secondary | ICD-10-CM | POA: Diagnosis not present

## 2024-09-19 MED ORDER — METOPROLOL SUCCINATE ER 50 MG PO TB24: 50.0000 mg | ORAL_TABLET | Freq: Every day | ORAL | 3 refills | Status: AC

## 2024-09-19 NOTE — Progress Notes (Signed)
 "  Subjective:    Patient ID: Sierra Peters, female    DOB: 1961-04-18, 63 y.o.   MRN: [id number]  HPI Virtual Visit via Video Note  I connected with the patient on 09/19/2024 at  4:00 PM EST by a video enabled telemedicine application and verified that I am speaking with the correct person using two identifiers.  Location patient: home Location provider:work or home office Persons participating in the virtual visit: patient, provider  I discussed the limitations of evaluation and management by telemedicine and the availability of in person appointments. The patient expressed understanding and agreed to proceed.   HPI: Here to follow up on newly diagnosed HTN. She was started on Metoprolol  succinate 50 mg daily 3 weeks ago, and it has been working well. Her BP has been averaging in the 120's over 80's. She had some lightheadedness for the first week but that went away, and she now feels fine .   ROS: See pertinent positives and negatives per HPI.  Past Medical History:  Diagnosis Date   Allergy    sees Dr. Cloretta   Anxiety    Chronic fatigue    Depression    Headache(784.0)    MVP (mitral valve prolapse)     Past Surgical History:  Procedure Laterality Date   APPENDECTOMY     AUGMENTATION MAMMAPLASTY Bilateral 09/2017   silicone    BREAST SURGERY     augmentation per Dr. Mercer   COLONOSCOPY  02/07/2013   per Dr. Rosalie, sigmoid diverticula, no polyps, repeat in 10 yrs    ESOPHAGOGASTRODUODENOSCOPY  02/07/2013   per Dr. Rosalie, esophageal erosions, hiatal hernia    TUBAL LIGATION      Family History  Problem Relation Age of Onset   Arthritis Other    Heart disease Other        cardiovascular disorder    Current Medications[1]  EXAM:  VITALS per patient if applicable:  GENERAL: alert, oriented, appears well and in no acute distress  HEENT: atraumatic, conjunttiva clear, no obvious abnormalities on inspection of external nose and ears  NECK: normal  movements of the head and neck  LUNGS: on inspection no signs of respiratory distress, breathing rate appears normal, no obvious gross SOB, gasping or wheezing  CV: no obvious cyanosis  MS: moves all visible extremities without noticeable abnormality  PSYCH/NEURO: pleasant and cooperative, no obvious depression or anxiety, speech and thought processing grossly intact  ASSESSMENT AND PLAN: HTN. We will stay on the current regimen. Follow up in 90 days. Garnette Olmsted, MD  Discussed the following assessment and plan:  No diagnosis found.     I discussed the assessment and treatment plan with the patient. The patient was provided an opportunity to ask questions and all were answered. The patient agreed with the plan and demonstrated an understanding of the instructions.   The patient was advised to call back or seek an in-person evaluation if the symptoms worsen or if the condition fails to improve as anticipated.      Review of Systems     Objective:   Physical Exam        Assessment & Plan:       [1]  Current Outpatient Medications:    alendronate  (FOSAMAX ) 35 MG tablet, Take 1 tablet (35 mg total) by mouth every 7 (seven) days. Take with a full glass of water on an empty stomach., Disp: 12 tablet, Rfl: 3   ALPRAZolam  (XANAX ) 0.5 MG tablet, Take 1 tablet (0.5  mg total) by mouth at bedtime., Disp: , Rfl:    desloratadine-pseudoephedrine (CLARINEX-D 24-HOUR) 5-240 MG per 24 hr tablet, Take 1 tablet by mouth daily., Disp: , Rfl:    diphenoxylate -atropine  (LOMOTIL ) 2.5-0.025 MG tablet, TAKE 2 TABLETS BY MOUTH 4 (FOUR) TIMES DAILY AS NEEDED FOR DIARRHEA OR LOOSE STOOLS., Disp: 120 tablet, Rfl: 5   escitalopram  (LEXAPRO ) 20 MG tablet, TAKE 1 TABLET BY MOUTH EVERY DAY, Disp: 90 tablet, Rfl: 3   estradiol  (VIVELLE -DOT) 0.05 MG/24HR patch, Place 1 patch onto the skin 2 (two) times a week., Disp: , Rfl:    lamoTRIgine  (LAMICTAL ) 100 MG tablet, TAKE 1 TABLET BY MOUTH EVERY DAY,  Disp: 90 tablet, Rfl: 0   meloxicam  (MOBIC ) 15 MG tablet, TAKE 1 TABLET BY MOUTH EVERY DAY, Disp: 90 tablet, Rfl: 3   metoprolol  succinate (TOPROL -XL) 50 MG 24 hr tablet, Take 1 tablet (50 mg total) by mouth daily. Take with or immediately following a meal., Disp: 30 tablet, Rfl: 3   minocycline (DYNACIN) 100 MG tablet, Take 100 mg by mouth once., Disp: , Rfl:    montelukast (SINGULAIR) 10 MG tablet, Take 10 mg by mouth at bedtime., Disp: , Rfl:    progesterone  (PROMETRIUM ) 100 MG capsule, Take 100 mg by mouth daily., Disp: , Rfl:    zolpidem  (AMBIEN ) 10 MG tablet, TAKE 1 TABLET BY MOUTH EVERYDAY AT BEDTIME, Disp: 45 tablet, Rfl: 1  "

## 2024-10-16 ENCOUNTER — Other Ambulatory Visit: Payer: Self-pay | Admitting: Family Medicine

## 2024-10-16 DIAGNOSIS — F418 Other specified anxiety disorders: Secondary | ICD-10-CM
